# Patient Record
Sex: Male | Born: 1980
Health system: Southern US, Community
[De-identification: ages and names within clinical notes are randomized; demographics above are authoritative.]

## PROBLEM LIST (undated history)

## (undated) HISTORY — PX: LUMBAR DISC ARTHROPLASTY: SHX699

---

## 2009-11-02 ENCOUNTER — Emergency Department (HOSPITAL_COMMUNITY): Admission: EM | Admit: 2009-11-02 | Discharge: 2009-11-03 | Payer: Self-pay | Admitting: Emergency Medicine

## 2017-02-05 ENCOUNTER — Other Ambulatory Visit: Payer: Self-pay | Admitting: Orthopaedic Surgery

## 2017-02-05 DIAGNOSIS — M5136 Other intervertebral disc degeneration, lumbar region: Secondary | ICD-10-CM

## 2017-06-22 DIAGNOSIS — M5416 Radiculopathy, lumbar region: Secondary | ICD-10-CM | POA: Insufficient documentation

## 2017-07-19 ENCOUNTER — Encounter: Payer: Self-pay | Admitting: Family Medicine

## 2017-07-19 ENCOUNTER — Ambulatory Visit (INDEPENDENT_AMBULATORY_CARE_PROVIDER_SITE_OTHER): Payer: 59 | Admitting: Family Medicine

## 2017-07-19 VITALS — BP 126/78 | HR 79 | Temp 98.5°F | Ht 75.0 in | Wt 199.0 lb

## 2017-07-19 DIAGNOSIS — Z0000 Encounter for general adult medical examination without abnormal findings: Secondary | ICD-10-CM | POA: Diagnosis not present

## 2017-07-19 NOTE — Progress Notes (Signed)
Lucas Hoover. is a 36 y.o. male is here to [facility].   Patient Care Team: Helane Rima, DO as PCP - General (Family Medicine)   History of Present Illness:   Joseph Art, CMA, acting as scribe for Dr. Earlene Plater.  HPI: No concerns. Recent had lumbar discectomy. Doing well.  Health Maintenance Due  Topic Date Due  . HIV Screening  07/25/1996  . TETANUS/TDAP  07/25/2000   Depression screen PHQ 2/9 07/19/2017  Decreased Interest 0  Down, Depressed, Hopeless 0  PHQ - 2 Score 0   PMHx, SurgHx, SocialHx, Medications, and Allergies were reviewed in the Visit Navigator and updated as appropriate.   History reviewed. No pertinent past medical history.  Past Surgical History:  Procedure Laterality Date  . LUMBAR DISC ARTHROPLASTY     History reviewed. No pertinent family history.   Social History  Substance Use Topics  . Smoking status: Never Smoker  . Smokeless tobacco: Never Used  . Alcohol use 1.2 oz/week    2 Cans of beer per week   Current Medications and Allergies:  No current outpatient prescriptions on file.  No Known Allergies Review of Systems:   Pertinent items are noted in the HPI. Otherwise, ROS is negative.  Vitals:   Vitals:   07/19/17 0909  BP: 126/78  Pulse: 79  Temp: 98.5 F (36.9 C)  TempSrc: Oral  SpO2: 98%  Weight: 199 lb (90.3 kg)  Height:  (1.905 m)     Body mass index is 24.87 kg/m.   Physical Exam:   Physical Exam  Constitutional: He is oriented to person, place, and time. He appears well-developed and well-nourished. No distress.  HENT:  Head: Normocephalic and atraumatic.  Right Ear: External ear normal.  Left Ear: External ear normal.  Nose: Nose normal.  Mouth/Throat: Oropharynx is clear and moist.  Eyes: Pupils are equal, round, and reactive to light. Conjunctivae and EOM are normal.  Neck: Normal range of motion. Neck supple.  Cardiovascular: Normal rate, regular rhythm, normal heart sounds and  intact distal pulses.   Pulmonary/Chest: Effort normal and breath sounds normal.  Abdominal: Soft. Bowel sounds are normal.  Musculoskeletal: Normal range of motion.  Neurological: He is alert and oriented to person, place, and time.  Skin: Skin is warm and dry.  Psychiatric: He has a normal mood and affect. His behavior is normal. Judgment and thought content normal.  Nursing note and vitals reviewed.  Assessment and Plan:   [name] was seen today for establish care.  Diagnoses and all orders for this visit:  Routine physical examination   Patient Counseling:   Nutrition: Stressed importance of moderation in sodium/caffeine intake, saturated fat and cholesterol, caloric balance, sufficient intake of fresh fruits, vegetables, and fiber.    Stressed the importance of regular exercise.     Substance Abuse: Discussed cessation/primary prevention of tobacco, alcohol, or other drug use; driving or other dangerous activities under the influence; availability of treatment for abuse.     Injury prevention: Discussed safety belts, safety helmets, smoke detector, smoking near bedding or upholstery.     Sexuality: Discussed sexually transmitted diseases, partner selection, use of condoms, avoidance of unintended pregnancy and contraceptive alternatives.     Dental health: Discussed importance of regular tooth brushing, flossing, and dental visits.    Health maintenance and immunizations reviewed. Please refer to Health maintenance section.    . Reviewed expectations re: course of current medical issues. . Discussed self-management of symptoms. . Outlined  signs and symptoms indicating need for more acute intervention. . Patient verbalized understanding and all questions were answered. Marland Kitchen Health Maintenance issues including appropriate healthy diet, exercise, and smoking avoidance were discussed with patient. . See orders for this visit as documented in the electronic medical  record. . Patient received an After Visit Summary.  CMA served as Neurosurgeon during this visit. History, Physical, and Plan performed by medical provider. The above documentation has been reviewed and is accurate and complete. Helane Rima, D.O.  Helane Rima, DO [place] 07/20/2017  No future appointments.

## 2017-07-20 ENCOUNTER — Encounter: Payer: Self-pay | Admitting: Family Medicine

## 2017-09-09 ENCOUNTER — Ambulatory Visit: Payer: 59 | Admitting: Family Medicine

## 2017-09-09 ENCOUNTER — Encounter: Payer: Self-pay | Admitting: Family Medicine

## 2017-09-09 VITALS — BP 134/72 | HR 73 | Temp 98.3°F | Wt 201.8 lb

## 2017-09-09 DIAGNOSIS — J029 Acute pharyngitis, unspecified: Secondary | ICD-10-CM

## 2017-09-09 LAB — POCT RAPID STREP A (OFFICE): Rapid Strep A Screen: NEGATIVE

## 2017-09-09 MED ORDER — PREDNISONE 5 MG PO TABS: 5.0000 mg | ORAL_TABLET | Freq: Every day | ORAL | 0 refills | Status: DC

## 2017-09-09 MED ORDER — AMOXICILLIN 875 MG PO TABS: 875.0000 mg | ORAL_TABLET | Freq: Two times a day (BID) | ORAL | 0 refills | Status: DC

## 2017-09-09 NOTE — Progress Notes (Signed)
   Lucas Columbusobert Lawrence Glynn Octaveriscak Jr. is a 36 y.o. male here for an acute visit.  History of Present Illness:   Sore Throat   This is a new problem. The current episode started yesterday. The problem has been gradually worsening. There has been no fever. The pain is moderate. Associated symptoms include a hoarse voice and swollen glands. Pertinent negatives include no coughing, headaches, shortness of breath, stridor or trouble swallowing. He has tried acetaminophen and cool liquids for the symptoms. The treatment provided mild relief.   PMHx, SurgHx, SocialHx, Medications, and Allergies were reviewed in the Visit Navigator and updated as appropriate.  Current Medications:   .  None  No Known Allergies   Review of Systems:   Pertinent items are noted in the HPI. Otherwise, ROS is negative.  Vitals:   Vitals:   09/09/17 1427  BP: 134/72  Pulse: 73  Temp: 98.3 F (36.8 C)  TempSrc: Oral  SpO2: 98%  Weight: 201 lb 12.8 oz (91.5 kg)     Body mass index is 25.22 kg/m.   Physical Exam:   Physical Exam  Constitutional: He is oriented to person, place, and time. He appears well-developed and well-nourished. No distress.  HENT:  Head: Normocephalic and atraumatic.  Right Ear: Tympanic membrane and external ear normal.  Left Ear: Tympanic membrane and external ear normal.  Nose: Nose normal.  Mouth/Throat: Posterior oropharyngeal edema and posterior oropharyngeal erythema present. Tonsils are 2+ on the right. Tonsils are 2+ on the left.  Eyes: Conjunctivae and EOM are normal. Pupils are equal, round, and reactive to light.  Neck: Normal range of motion. Neck supple.  Cardiovascular: Normal rate, regular rhythm, normal heart sounds and intact distal pulses.  Pulmonary/Chest: Effort normal and breath sounds normal.  Abdominal: Soft. Bowel sounds are normal.  Musculoskeletal: Normal range of motion.  Neurological: He is alert and oriented to person, place, and time.  Skin: Skin is  warm and dry.  Psychiatric: He has a normal mood and affect. His behavior is normal. Judgment and thought content normal.  Nursing note and vitals reviewed.   Assessment and Plan:   Lucas Hoover was seen today for sore throat.  Diagnoses and all orders for this visit:  Pharyngitis, unspecified etiology Comments: Safety net provided. Red flags reviewed. Orders: -     amoxicillin (AMOXIL) 875 MG tablet; Take 1 tablet (875 mg total) by mouth 2 (two) times daily. (HOLD) -     predniSONE (DELTASONE) 5 MG tablet; Take 1 tablet (5 mg total) by mouth daily with breakfast. 6-5-4-3-2-1-off -     Culture, Group A Strep -     POCT Rapid Strep A  . Reviewed expectations re: course of current medical issues. . Discussed self-management of symptoms. . Outlined signs and symptoms indicating need for more acute intervention. . Patient verbalized understanding and all questions were answered. Marland Kitchen. Health Maintenance issues including appropriate healthy diet, exercise, and smoking avoidance were discussed with patient. . See orders for this visit as documented in the electronic medical record. . Patient received an After Visit Summary.  Helane RimaErica Lacretia Tindall, DO [place] 09/09/2017

## 2017-09-11 LAB — CULTURE, GROUP A STREP
MICRO NUMBER:: 81342644
SPECIMEN QUALITY:: ADEQUATE

## 2017-09-13 ENCOUNTER — Telehealth: Payer: Self-pay | Admitting: Family Medicine

## 2017-09-13 NOTE — Telephone Encounter (Signed)
Copied from CRM [id number]. Topic: Quick Communication - See Telephone Encounter >> Sep 13, 2017  4:41 PM Arlyss Gandyichardson, Virna Livengood N, NT wrote: CRM for notification. See Telephone encounter for: Pt calling to get lab results.   09/13/17.

## 2017-09-14 NOTE — Telephone Encounter (Signed)
Do not see where it was resulted yet. Ok to tell him it is negative.

## 2017-09-14 NOTE — Telephone Encounter (Signed)
Labs were negative.  If he is calling, I wonder if he is feeling poorly?  Please check on him.

## 2017-09-15 NOTE — Telephone Encounter (Signed)
Left message to return call to our office.  

## 2018-07-12 ENCOUNTER — Ambulatory Visit: Payer: 59 | Admitting: Family Medicine

## 2018-07-12 NOTE — Progress Notes (Deleted)
   Lucas Hoover. is a 37 y.o. male here for an acute visit.  History of Present Illness:   {CMA SCRIBE ATTESTATION}  HPI:   PMHx, SurgHx, SocialHx, Medications, and Allergies were reviewed in the Visit Navigator and updated as appropriate.  Current Medications:   Current Outpatient Medications:  .  amoxicillin (AMOXIL) 875 MG tablet, Take 1 tablet (875 mg total) by mouth 2 (two) times daily., Disp: 20 tablet, Rfl: 0 .  predniSONE (DELTASONE) 5 MG tablet, Take 1 tablet (5 mg total) by mouth daily with breakfast. 6-5-4-3-2-1-off, Disp: 21 tablet, Rfl: 0   No Known Allergies Review of Systems:   Pertinent items are noted in the HPI. Otherwise, ROS is negative.  Vitals:  There were no vitals filed for this visit.   There is no height or weight on file to calculate BMI.  Physical Exam:   Physical Exam  Results for orders placed or performed in visit on 09/09/17  Culture, Group A Strep  Result Value Ref Range   MICRO NUMBER: [id number]    SPECIMEN QUALITY: ADEQUATE    SOURCE: THROAT    STATUS: FINAL    RESULT: No group A Streptococcus isolated   POCT rapid strep A  Result Value Ref Range   Rapid Strep A Screen Negative Negative    Assessment and Plan:   There are no diagnoses linked to this encounter.  . Reviewed expectations re: course of current medical issues. . Discussed self-management of symptoms. . Outlined signs and symptoms indicating need for more acute intervention. . Patient verbalized understanding and all questions were answered. Marland Kitchen Health Maintenance issues including appropriate healthy diet, exercise, and smoking avoidance were discussed with patient. . See orders for this visit as documented in the electronic medical record. . Patient received an After Visit Summary.  *** CMA served as Neurosurgeon during this visit. History, Physical, and Plan performed by medical provider. The above documentation has been reviewed and is accurate and complete.  Helane Rima, D.O.  Helane Rima, DO [place], [facility] 07/12/2018

## 2018-07-15 ENCOUNTER — Encounter: Payer: Self-pay | Admitting: Family Medicine

## 2018-10-31 ENCOUNTER — Encounter: Payer: Self-pay | Admitting: Physician Assistant

## 2018-10-31 ENCOUNTER — Ambulatory Visit: Payer: 59 | Admitting: Physician Assistant

## 2018-10-31 VITALS — BP 110/68 | HR 109 | Temp 99.1°F | Ht 75.0 in | Wt 186.2 lb

## 2018-10-31 DIAGNOSIS — J020 Streptococcal pharyngitis: Secondary | ICD-10-CM | POA: Diagnosis not present

## 2018-10-31 LAB — POCT RAPID STREP A (OFFICE): Rapid Strep A Screen: POSITIVE — AB

## 2018-10-31 MED ORDER — PREDNISONE 5 MG PO TABS: ORAL_TABLET | ORAL | 0 refills | Status: DC

## 2018-10-31 MED ORDER — AMOXICILLIN 875 MG PO TABS
875.0000 mg | ORAL_TABLET | Freq: Two times a day (BID) | ORAL | 0 refills | Status: DC
Start: 2018-10-31 — End: 2019-03-08

## 2018-10-31 NOTE — Progress Notes (Signed)
Lucas Hoover. is a 38 y.o. male here for a new problem.  I acted as a Neurosurgeon for [employer], PA-C  Lucas Mull, LPN  History of Present Illness:   Chief Complaint  Patient presents with  . Sore Throat    Sore Throat   This is a new problem. Episode onset: Started on Saturday again was treated for Strep 5-6 weeks ago. The problem has been gradually worsening. Neither side of throat is experiencing more pain than the other. There has been no fever. The pain is at a severity of 8/10. The pain is moderate. Associated symptoms include a hoarse voice, neck pain, swollen glands and trouble swallowing. Pertinent negatives include no abdominal pain, coughing, diarrhea, ear discharge, ear pain, headaches, shortness of breath or vomiting. Exposure to: Has Childeren at home. He has tried nothing for the symptoms.    History reviewed. No pertinent past medical history.   Social History   Socioeconomic History  . Marital status: Married    Spouse name: Not on file  . Number of children: Not on file  . Years of education: Not on file  . Highest education level: Not on file  Occupational History  . Not on file  Social Needs  . Financial resource strain: Not on file  . Food insecurity:    Worry: Not on file    Inability: Not on file  . Transportation needs:    Medical: Not on file    Non-medical: Not on file  Tobacco Use  . Smoking status: Never Smoker  . Smokeless tobacco: Never Used  Substance and Sexual Activity  . Alcohol use: Yes    Alcohol/week: 2.0 standard drinks    Types: 2 Cans of beer per week  . Drug use: No  . Sexual activity: Not on file  Lifestyle  . Physical activity:    Days per week: Not on file    Minutes per session: Not on file  . Stress: Not on file  Relationships  . Social connections:    Talks on phone: Not on file    Gets together: Not on file    Attends religious service: Not on file    Active member of club or organization: Not on  file    Attends meetings of clubs or organizations: Not on file    Relationship status: Not on file  . Intimate partner violence:    Fear of current or ex partner: Not on file    Emotionally abused: Not on file    Physically abused: Not on file    Forced sexual activity: Not on file  Other Topics Concern  . Not on file  Social History Narrative  . Not on file    Past Surgical History:  Procedure Laterality Date  . LUMBAR DISC ARTHROPLASTY      History reviewed. No pertinent family history.  No Known Allergies  Current Medications:   Current Outpatient Medications:  .  amoxicillin (AMOXIL) 875 MG tablet, Take 1 tablet (875 mg total) by mouth 2 (two) times daily., Disp: 20 tablet, Rfl: 0 .  predniSONE (DELTASONE) 5 MG tablet, 6-5-4-3-2-1-off, Disp: 21 tablet, Rfl: 0   Review of Systems:   Review of Systems  HENT: Positive for hoarse voice and trouble swallowing. Negative for ear discharge and ear pain.   Respiratory: Negative for cough and shortness of breath.   Gastrointestinal: Negative for abdominal pain, diarrhea and vomiting.  Musculoskeletal: Positive for neck pain.  Neurological: Negative for headaches.  Vitals:   Vitals:   10/31/18 0942  BP: 110/68  Pulse: (!) 109  Temp: 99.1 F (37.3 C)  TempSrc: Oral  SpO2: 99%  Weight: 186 lb 4 oz (84.5 kg)  Height: 6\' 3"  (1.905 m)     Body mass index is 23.28 kg/m.  Physical Exam:   Physical Exam Vitals signs and nursing note reviewed.  Constitutional:      General: He is not in acute distress.    Appearance: He is well-developed. He is not ill-appearing or toxic-appearing.  HENT:     Head: Normocephalic and atraumatic.     Right Ear: Tympanic membrane, ear canal and external ear normal. Tympanic membrane is not erythematous, retracted or bulging.     Left Ear: Tympanic membrane, ear canal and external ear normal. Tympanic membrane is not erythematous, retracted or bulging.     Nose: Nose normal.      Right Sinus: No maxillary sinus tenderness or frontal sinus tenderness.     Left Sinus: No maxillary sinus tenderness or frontal sinus tenderness.     Mouth/Throat:     Pharynx: Uvula midline. No posterior oropharyngeal erythema.     Tonsils: Tonsillar exudate present. Swelling: 2+ on the right. 2+ on the left.  Eyes:     General: Lids are normal.     Conjunctiva/sclera: Conjunctivae normal.  Neck:     Trachea: Trachea normal.  Cardiovascular:     Rate and Rhythm: Normal rate and regular rhythm.     Heart sounds: Normal heart sounds, S1 normal and S2 normal.  Pulmonary:     Effort: Pulmonary effort is normal.     Breath sounds: Normal breath sounds. No decreased breath sounds, wheezing, rhonchi or rales.  Lymphadenopathy:     Cervical: Cervical adenopathy present.     Right cervical: Superficial cervical adenopathy present.     Left cervical: Superficial cervical adenopathy present.  Skin:    General: Skin is warm and dry.  Neurological:     Mental Status: He is alert.  Psychiatric:        Speech: Speech normal.        Behavior: Behavior normal. Behavior is cooperative.    Results for orders placed or performed in visit on 10/31/18  POCT rapid strep A  Result Value Ref Range   Rapid Strep A Screen Positive (A) Negative     Assessment and Plan:   Lucas MaduroRobert was seen today for sore throat.  Diagnoses and all orders for this visit:  Strep pharyngitis -     POCT rapid strep A  Other orders -     amoxicillin (AMOXIL) 875 MG tablet; Take 1 tablet (875 mg total) by mouth 2 (two) times daily. -     predniSONE (DELTASONE) 5 MG tablet; 6-5-4-3-2-1-off   No red flags on exam.  Rapid strep positive in office. Will initiate Amoxicillin and Prednisone per orders. Discussed taking medications as prescribed. Reviewed return precautions including worsening fever, SOB, worsening cough or other concerns. Push fluids and rest. I recommend that patient follow-up if symptoms worsen or persist  despite treatment x 7-10 days, sooner if needed.  . Reviewed expectations re: course of current medical issues. . Discussed self-management of symptoms. . Outlined signs and symptoms indicating need for more acute intervention. . Patient verbalized understanding and all questions were answered. . See orders for this visit as documented in the electronic medical record. . Patient received an After-Visit Summary.  CMA or LPN served as Neurosurgeonscribe during this  visit. History, Physical, and Plan performed by medical provider. The above documentation has been reviewed and is accurate and complete.  Jarold Motto, PA-C

## 2018-10-31 NOTE — Patient Instructions (Signed)
It was great to see you!  You have strep throat.  Start oral antibiotic and take as directed.  May take oral steroid to help with pain and swelling. You don't need to take ibuprofen while on this.  Push fluids and get plenty of rest. Please return if you are not improving as expected, or if you have high fevers (>101.5) or difficulty swallowing or worsening productive cough.  Call clinic with questions.  I hope you start feeling better soon!

## 2019-03-08 ENCOUNTER — Other Ambulatory Visit: Payer: Self-pay

## 2019-03-08 ENCOUNTER — Encounter: Payer: Self-pay | Admitting: Family Medicine

## 2019-03-08 ENCOUNTER — Ambulatory Visit (INDEPENDENT_AMBULATORY_CARE_PROVIDER_SITE_OTHER): Payer: 59 | Admitting: Family Medicine

## 2019-03-08 VITALS — Ht 75.0 in | Wt 185.0 lb

## 2019-03-08 DIAGNOSIS — R6882 Decreased libido: Secondary | ICD-10-CM | POA: Diagnosis not present

## 2019-03-08 DIAGNOSIS — R5383 Other fatigue: Secondary | ICD-10-CM

## 2019-03-08 DIAGNOSIS — Z13220 Encounter for screening for lipoid disorders: Secondary | ICD-10-CM

## 2019-03-08 DIAGNOSIS — R739 Hyperglycemia, unspecified: Secondary | ICD-10-CM

## 2019-03-08 DIAGNOSIS — L659 Nonscarring hair loss, unspecified: Secondary | ICD-10-CM

## 2019-03-08 DIAGNOSIS — Z114 Encounter for screening for human immunodeficiency virus [HIV]: Secondary | ICD-10-CM | POA: Diagnosis not present

## 2019-03-08 NOTE — Progress Notes (Signed)
Virtual Visit via Video   Due to the COVID-19 pandemic, this visit was completed with telemedicine (audio/video) technology to reduce patient and provider exposure as well as to preserve personal protective equipment.   I connected with Lucas Hoover. by a video enabled telemedicine application and verified that I am speaking with the correct person using two identifiers. Location patient: Home Location provider: [facility], Office Persons participating in the virtual visit: Lucas Supino Mckeithan Jr., Helane Rima, DO Barnie Mort, CMA acting as scribe for Dr. Helane Rima.    I discussed the limitations of evaluation and management by telemedicine and the availability of in person appointments. The patient expressed understanding and agreed to proceed.  Care Team   Patient Care Team: Helane Rima, DO as PCP - General (Family Medicine)  Subjective:   HPI: Patient has had decreased sex drive over the last year. He is also having some thinning of his hair. He is concerned about issues with his thyroid and would like to have labs checked.   Review of Systems  Constitutional: Negative for chills and fever.  HENT: Negative for hearing loss and tinnitus.   Eyes: Negative for blurred vision and double vision.  Respiratory: Negative for cough.   Cardiovascular: Negative for chest pain and palpitations.  Gastrointestinal: Negative for nausea and vomiting.  Genitourinary: Negative for dysuria and urgency.  Neurological: Negative for dizziness and headaches.  Psychiatric/Behavioral: Negative for suicidal ideas.    Patient Active Problem List   Diagnosis Date Noted  . Lumbar radiculopathy 06/22/2017    Social History   Tobacco Use  . Smoking status: Never Smoker  . Smokeless tobacco: Never Used  Substance Use Topics  . Alcohol use: Yes    Alcohol/week: 2.0 standard drinks    Types: 2 Cans of beer per week   No current outpatient medications on file.  No  Known Allergies  Objective:   VITALS: Per patient if applicable, see vitals. GENERAL: Alert, appears well and in no acute distress. HEENT: Atraumatic, conjunctiva clear, no obvious abnormalities on inspection of external nose and ears. NECK: Normal movements of the head and neck. CARDIOPULMONARY: No increased WOB. Speaking in clear sentences. I:E ratio WNL.  MS: Moves all visible extremities without noticeable abnormality. PSYCH: Pleasant and cooperative, well-groomed. Speech normal rate and rhythm. Affect is appropriate. Insight and judgement are appropriate. Attention is focused, linear, and appropriate.  NEURO: CN grossly intact. Oriented as arrived to appointment on time with no prompting. Moves both UE equally.  SKIN: No obvious lesions, wounds, erythema, or cyanosis noted on face or hands.  Depression screen PHQ 2/9 07/19/2017  Decreased Interest 0  Down, Depressed, Hopeless 0  PHQ - 2 Score 0   Assessment and Plan:   Lucas Hoover was seen today for follow-up.  Diagnoses and all orders for this visit:  Screening for HIV (human immunodeficiency virus) -     HIV Antibody (routine testing w rflx); Future  Other fatigue -     CBC with Differential/Platelet; Future -     Comprehensive metabolic panel; Future -     Hemoglobin A1c; Future -     TSH; Future -     Testos,Total,Free and SHBG (Male); Future  Decreased libido -     TSH; Future -     Testos,Total,Free and SHBG (Male); Future  Hair thinning -     TSH; Future -     Testos,Total,Free and SHBG (Male); Future  Screening for lipid disorders -  Lipid panel; Future  Hyperglycemia -     Hemoglobin A1c; Future    . COVID-19 Education: The signs and symptoms of COVID-19 were discussed with the patient and how to seek care for testing if needed. The importance of social distancing was discussed today. . Reviewed expectations re: course of current medical issues. . Discussed self-management of symptoms. . Outlined  signs and symptoms indicating need for more acute intervention. . Patient verbalized understanding and all questions were answered. Marland Kitchen. Health Maintenance issues including appropriate healthy diet, exercise, and smoking avoidance were discussed with patient. . See orders for this visit as documented in the electronic medical record.  Helane RimaErica Hosey Burmester, DO  Records requested if needed. Time spent: 25  minutes, of which >50% was spent in obtaining information about his symptoms, reviewing his previous labs, evaluations, and treatments, counseling him about his condition (please see the discussed topics above), and developing a plan to further investigate it; he had a number of questions which I addressed.

## 2019-03-09 ENCOUNTER — Other Ambulatory Visit (INDEPENDENT_AMBULATORY_CARE_PROVIDER_SITE_OTHER): Payer: 59

## 2019-03-09 DIAGNOSIS — Z114 Encounter for screening for human immunodeficiency virus [HIV]: Secondary | ICD-10-CM | POA: Diagnosis not present

## 2019-03-09 DIAGNOSIS — R5383 Other fatigue: Secondary | ICD-10-CM

## 2019-03-09 LAB — COMPREHENSIVE METABOLIC PANEL
ALT: 15 U/L (ref 0–53)
AST: 14 U/L (ref 0–37)
Albumin: 4.4 g/dL (ref 3.5–5.2)
Alkaline Phosphatase: 65 U/L (ref 39–117)
BUN: 12 mg/dL (ref 6–23)
CO2: 32 mEq/L (ref 19–32)
Calcium: 9.2 mg/dL (ref 8.4–10.5)
Chloride: 101 mEq/L (ref 96–112)
Creatinine, Ser: 0.9 mg/dL (ref 0.40–1.50)
GFR: 94.63 mL/min (ref >60.00)
Glucose, Bld: 87 mg/dL (ref 70–99)
Potassium: 4.4 mEq/L (ref 3.5–5.1)
Sodium: 139 mEq/L (ref 135–145)
Total Bilirubin: 0.5 mg/dL (ref 0.2–1.2)
Total Protein: 7.2 g/dL (ref 6.0–8.3)

## 2019-03-09 LAB — LIPID PANEL
Cholesterol: 204 mg/dL — ABNORMAL HIGH (ref 0–200)
HDL: 56.5 mg/dL (ref >39.00)
LDL Cholesterol: 127 mg/dL — ABNORMAL HIGH (ref 0–99)
NonHDL: 147.05
Total CHOL/HDL Ratio: 4
Triglycerides: 101 mg/dL (ref 0.0–149.0)
VLDL: 20.2 mg/dL (ref 0.0–40.0)

## 2019-03-09 LAB — TSH: TSH: 0.86 u[IU]/mL (ref 0.35–4.50)

## 2019-03-09 LAB — CBC WITH DIFFERENTIAL/PLATELET
Basophils Absolute: 0 10*3/uL (ref 0.0–0.1)
Basophils Relative: 0.9 % (ref 0.0–3.0)
Eosinophils Absolute: 0.1 10*3/uL (ref 0.0–0.7)
Eosinophils Relative: 1.1 % (ref 0.0–5.0)
HCT: 45.2 % (ref 39.0–52.0)
Hemoglobin: 15.5 g/dL (ref 13.0–17.0)
Lymphocytes Relative: 22.5 % (ref 12.0–46.0)
Lymphs Abs: 1.2 10*3/uL (ref 0.7–4.0)
MCHC: 34.2 g/dL (ref 30.0–36.0)
MCV: 88.2 fl (ref 78.0–100.0)
Monocytes Absolute: 0.5 10*3/uL (ref 0.1–1.0)
Monocytes Relative: 9.2 % (ref 3.0–12.0)
Neutro Abs: 3.4 10*3/uL (ref 1.4–7.7)
Neutrophils Relative %: 66.3 % (ref 43.0–77.0)
Platelets: 272 10*3/uL (ref 150.0–400.0)
RBC: 5.13 Mil/uL (ref 4.22–5.81)
RDW: 12.9 % (ref 11.5–15.5)
WBC: 5.2 10*3/uL (ref 4.0–10.5)

## 2019-03-09 LAB — HEMOGLOBIN A1C: Hgb A1c MFr Bld: 5.4 % (ref 4.6–6.5)

## 2019-03-10 ENCOUNTER — Encounter: Payer: Self-pay | Admitting: Family Medicine

## 2019-03-13 LAB — TESTOS,TOTAL,FREE AND SHBG (FEMALE)
Free Testosterone: 146.2 pg/mL (ref 35.0–155.0)
Sex Hormone Binding: 20 nmol/L (ref 10–50)
Testosterone, Total, LC-MS-MS: 504 ng/dL (ref 250–1100)

## 2019-03-13 LAB — HIV ANTIBODY (ROUTINE TESTING W REFLEX): HIV 1&2 Ab, 4th Generation: NONREACTIVE

## 2020-11-18 ENCOUNTER — Other Ambulatory Visit: Payer: Self-pay

## 2020-11-18 ENCOUNTER — Encounter: Payer: Self-pay | Admitting: Physician Assistant

## 2020-11-18 ENCOUNTER — Ambulatory Visit: Payer: 59 | Admitting: Physician Assistant

## 2020-11-18 VITALS — BP 120/90 | HR 73 | Temp 97.6°F | Ht 75.0 in | Wt 215.0 lb

## 2020-11-18 DIAGNOSIS — R222 Localized swelling, mass and lump, trunk: Secondary | ICD-10-CM

## 2020-11-18 LAB — CBC WITH DIFFERENTIAL/PLATELET
Basophils Absolute: 0 10*3/uL (ref 0.0–0.1)
Basophils Relative: 0.7 % (ref 0.0–3.0)
Eosinophils Absolute: 0.1 10*3/uL (ref 0.0–0.7)
Eosinophils Relative: 1.5 % (ref 0.0–5.0)
HCT: 45.9 % (ref 39.0–52.0)
Hemoglobin: 15.7 g/dL (ref 13.0–17.0)
Lymphocytes Relative: 21.7 % (ref 12.0–46.0)
Lymphs Abs: 1.6 10*3/uL (ref 0.7–4.0)
MCHC: 34.1 g/dL (ref 30.0–36.0)
MCV: 86.4 fl (ref 78.0–100.0)
Monocytes Absolute: 0.8 10*3/uL (ref 0.1–1.0)
Monocytes Relative: 10.7 % (ref 3.0–12.0)
Neutro Abs: 4.8 10*3/uL (ref 1.4–7.7)
Neutrophils Relative %: 65.4 % (ref 43.0–77.0)
Platelets: 297 10*3/uL (ref 150.0–400.0)
RBC: 5.31 Mil/uL (ref 4.22–5.81)
RDW: 12.7 % (ref 11.5–15.5)
WBC: 7.3 10*3/uL (ref 4.0–10.5)

## 2020-11-18 LAB — COMPREHENSIVE METABOLIC PANEL
ALT: 22 U/L (ref 0–53)
AST: 19 U/L (ref 0–37)
Albumin: 4.6 g/dL (ref 3.5–5.2)
Alkaline Phosphatase: 68 U/L (ref 39–117)
BUN: 19 mg/dL (ref 6–23)
CO2: 30 mEq/L (ref 19–32)
Calcium: 9.6 mg/dL (ref 8.4–10.5)
Chloride: 102 mEq/L (ref 96–112)
Creatinine, Ser: 0.86 mg/dL (ref 0.40–1.50)
GFR: 109.22 mL/min (ref >60.00)
Glucose, Bld: 101 mg/dL — ABNORMAL HIGH (ref 70–99)
Potassium: 4.1 mEq/L (ref 3.5–5.1)
Sodium: 139 mEq/L (ref 135–145)
Total Bilirubin: 0.3 mg/dL (ref 0.2–1.2)
Total Protein: 7.7 g/dL (ref 6.0–8.3)

## 2020-11-18 NOTE — Patient Instructions (Signed)
It was great to see you!  Keep me posted on your symptoms. If it does not continue to improve, we will get an ultrasound.  Also we will update your blood work today.  Take care,  Jarold Motto PA-C

## 2020-11-18 NOTE — Progress Notes (Signed)
Lucas Hoover. is a 40 y.o. male here for a new problem.  I acted as a Neurosurgeon for [employer], PA-C Corky Mull, LPN   History of Present Illness:   Chief Complaint  Patient presents with  . Edema    HPI   Edema Pt c/o swelling left side mid abdomen noticed it Thursday night. Area was tender around a 6/10 over the weekend and has been gradually improving with time. Denies: known trauma, fever, chills, skin discoloration, changes in urination, pain with urination, pain with defecation, unintentional weight loss.  Reports history of fatty tumors in his family, ?lipomas    History reviewed. No pertinent past medical history.   Social History   Tobacco Use  . Smoking status: Never Smoker  . Smokeless tobacco: Never Used  Substance Use Topics  . Alcohol use: Yes    Alcohol/week: 2.0 standard drinks    Types: 2 Cans of beer per week  . Drug use: No    Past Surgical History:  Procedure Laterality Date  . LUMBAR DISC ARTHROPLASTY      Family History  Problem Relation Age of Onset  . Thyroid disease Mother     No Known Allergies  Current Medications:  No current outpatient medications on file.   Review of Systems:   ROS Negative unless otherwise specified per HPI.  Vitals:   Vitals:   11/18/20 1335  BP: 120/90  Pulse: 73  Temp: 97.6 F (36.4 C)  TempSrc: Temporal  SpO2: 97%  Weight: 215 lb (97.5 kg)  Height: 6\' 3"  (1.905 m)     Body mass index is 26.87 kg/m.  Physical Exam:   Physical Exam Vitals and nursing note reviewed.  Constitutional:      General: He is not in acute distress.    Appearance: He is well-developed. He is not ill-appearing, toxic-appearing or sickly-appearing.  Cardiovascular:     Rate and Rhythm: Normal rate and regular rhythm.     Pulses: Normal pulses.     Heart sounds: Normal heart sounds, S1 normal and S2 normal.     Comments: No LE edema Pulmonary:     Effort: Pulmonary effort is normal.      Breath sounds: Normal breath sounds.  Musculoskeletal:     Comments: Area of very mild diffuse swelling to LLQ of abdomen/hip area; but without TTP or skin changes. Some course subcutaneous tissue can be palpated compared to R side, but not in discrete nodules.  Skin:    General: Skin is warm, dry and intact.  Neurological:     Mental Status: He is alert.     GCS: GCS eye subscore is 4. GCS verbal subscore is 5. GCS motor subscore is 6.  Psychiatric:        Mood and Affect: Mood and affect normal.        Speech: Speech normal.        Behavior: Behavior normal. Behavior is cooperative.       Assessment and Plan:   Lucas Hoover was seen today for edema.  Diagnoses and all orders for this visit:  Localized swelling of abdominal wall -     CBC with Differential/Platelet -     Comprehensive metabolic panel   Unclear etiology. Exam normal and symptoms improving with time. Update CBC and CMP. Will obtain u/s if symptoms worsen or do not completely resolved.  CMA or LPN served as scribe during this visit. History, Physical, and Plan performed by medical provider. The above documentation  has been reviewed and is accurate and complete.  Inda Coke, PA-C

## 2020-11-19 ENCOUNTER — Other Ambulatory Visit: Payer: Self-pay | Admitting: Physician Assistant

## 2020-11-19 ENCOUNTER — Encounter: Payer: Self-pay | Admitting: Physician Assistant

## 2020-11-20 ENCOUNTER — Other Ambulatory Visit: Payer: Self-pay | Admitting: Physician Assistant

## 2020-11-20 DIAGNOSIS — R222 Localized swelling, mass and lump, trunk: Secondary | ICD-10-CM

## 2020-11-21 ENCOUNTER — Ambulatory Visit (HOSPITAL_COMMUNITY): Payer: 59

## 2020-11-22 ENCOUNTER — Ambulatory Visit
Admission: RE | Admit: 2020-11-22 | Discharge: 2020-11-22 | Disposition: A | Discharge status: Home / Self Care | Payer: 59 | Source: Ambulatory Visit | Attending: Physician Assistant | Admitting: Physician Assistant

## 2020-11-22 DIAGNOSIS — R222 Localized swelling, mass and lump, trunk: Secondary | ICD-10-CM

## 2021-02-07 ENCOUNTER — Other Ambulatory Visit: Payer: Self-pay

## 2021-02-07 ENCOUNTER — Encounter: Payer: Self-pay | Admitting: Physician Assistant

## 2021-02-07 ENCOUNTER — Ambulatory Visit (INDEPENDENT_AMBULATORY_CARE_PROVIDER_SITE_OTHER): Payer: 59 | Admitting: Physician Assistant

## 2021-02-07 VITALS — BP 126/82 | HR 64 | Temp 98.2°F | Ht 75.0 in | Wt 216.2 lb

## 2021-02-07 DIAGNOSIS — E663 Overweight: Secondary | ICD-10-CM

## 2021-02-07 DIAGNOSIS — E785 Hyperlipidemia, unspecified: Secondary | ICD-10-CM

## 2021-02-07 DIAGNOSIS — Z0000 Encounter for general adult medical examination without abnormal findings: Secondary | ICD-10-CM | POA: Diagnosis not present

## 2021-02-07 DIAGNOSIS — Z1159 Encounter for screening for other viral diseases: Secondary | ICD-10-CM | POA: Diagnosis not present

## 2021-02-07 LAB — COMPREHENSIVE METABOLIC PANEL
ALT: 34 U/L (ref 0–53)
AST: 23 U/L (ref 0–37)
Albumin: 4.8 g/dL (ref 3.5–5.2)
Alkaline Phosphatase: 76 U/L (ref 39–117)
BUN: 12 mg/dL (ref 6–23)
CO2: 29 mEq/L (ref 19–32)
Calcium: 9.6 mg/dL (ref 8.4–10.5)
Chloride: 102 mEq/L (ref 96–112)
Creatinine, Ser: 0.91 mg/dL (ref 0.40–1.50)
GFR: 106.15 mL/min (ref >60.00)
Glucose, Bld: 90 mg/dL (ref 70–99)
Potassium: 4.4 mEq/L (ref 3.5–5.1)
Sodium: 139 mEq/L (ref 135–145)
Total Bilirubin: 0.4 mg/dL (ref 0.2–1.2)
Total Protein: 7.6 g/dL (ref 6.0–8.3)

## 2021-02-07 LAB — CBC WITH DIFFERENTIAL/PLATELET
Basophils Absolute: 0 10*3/uL (ref 0.0–0.1)
Basophils Relative: 0.5 % (ref 0.0–3.0)
Eosinophils Absolute: 0.1 10*3/uL (ref 0.0–0.7)
Eosinophils Relative: 1.6 % (ref 0.0–5.0)
HCT: 46.8 % (ref 39.0–52.0)
Hemoglobin: 15.9 g/dL (ref 13.0–17.0)
Lymphocytes Relative: 24.6 % (ref 12.0–46.0)
Lymphs Abs: 1.3 10*3/uL (ref 0.7–4.0)
MCHC: 33.9 g/dL (ref 30.0–36.0)
MCV: 87 fl (ref 78.0–100.0)
Monocytes Absolute: 0.5 10*3/uL (ref 0.1–1.0)
Monocytes Relative: 10.3 % (ref 3.0–12.0)
Neutro Abs: 3.2 10*3/uL (ref 1.4–7.7)
Neutrophils Relative %: 63 % (ref 43.0–77.0)
Platelets: 271 10*3/uL (ref 150.0–400.0)
RBC: 5.39 Mil/uL (ref 4.22–5.81)
RDW: 13 % (ref 11.5–15.5)
WBC: 5.1 10*3/uL (ref 4.0–10.5)

## 2021-02-07 LAB — LIPID PANEL
Cholesterol: 264 mg/dL — ABNORMAL HIGH (ref 0–200)
HDL: 50.7 mg/dL (ref >39.00)
LDL Cholesterol: 189 mg/dL — ABNORMAL HIGH (ref 0–99)
NonHDL: 213.41
Total CHOL/HDL Ratio: 5
Triglycerides: 122 mg/dL (ref 0.0–149.0)
VLDL: 24.4 mg/dL (ref 0.0–40.0)

## 2021-02-07 NOTE — Progress Notes (Signed)
Subjective:    Ulysees Robarts. is a 40 y.o. male and is here for a comprehensive physical exam.  HPI  Health Maintenance Due  Topic Date Due  . Hepatitis C Screening  Never done    Acute Concerns: None  Chronic Issues: HLD -- borderline elevated LDL at last check; currently not on medications. Denies significant family hx of heart disease.  Health Maintenance: Immunizations -- UTD Colonoscopy -- n/a; no fam hx PSA -- n/a; no fam hx Diet -- eats out 2-3 x week; drinks plenty of water; limited juices/sodas Sleep habits -- no concerns Exercise -- goes in spurts; tries to stay active; sedentary job Weight -- Weight: 216 lb 4 oz (98.1 kg)  Weight history Wt Readings from Last 10 Encounters:  02/07/21 216 lb 4 oz (98.1 kg)  11/18/20 215 lb (97.5 kg)  03/08/19 185 lb (83.9 kg)  10/31/18 186 lb 4 oz (84.5 kg)  09/09/17 201 lb 12.8 oz (91.5 kg)  07/19/17 199 lb (90.3 kg)   Body mass index is 27.03 kg/m. Mood -- overall stable Tobacco use --  Tobacco Use: Low Risk   . Smoking Tobacco Use: Never Smoker  . Smokeless Tobacco Use: Never Used    Alcohol use ---  reports current alcohol use of about 2.0 standard drinks of alcohol per week.   Depression screen PHQ 2/9 02/07/2021  Decreased Interest 0  Down, Depressed, Hopeless 0  PHQ - 2 Score 0   UTD with dentist and eye doctor  Other providers/specialists: Patient Care Team: Jarold Motto, [place] as PCP - General (Physician Assistant)   PMHx, SurgHx, SocialHx, Medications, and Allergies were reviewed in the Visit Navigator and updated as appropriate.   History reviewed. No pertinent past medical history.   Past Surgical History:  Procedure Laterality Date  . LUMBAR DISC ARTHROPLASTY       Family History  Problem Relation Age of Onset  . Thyroid disease Mother   . Hypertension Mother   . Breast cancer Paternal Grandmother   . Colon cancer Neg Hx   . Prostate cancer Neg Hx     Social History    Tobacco Use  . Smoking status: Never Smoker  . Smokeless tobacco: Never Used  Vaping Use  . Vaping Use: Never used  Substance Use Topics  . Alcohol use: Yes    Alcohol/week: 2.0 standard drinks    Types: 2 Cans of beer per week  . Drug use: No    Review of Systems:   ROS  Objective:   Vitals:   02/07/21 0923  BP: 126/82  Pulse: 64  Temp: 98.2 F (36.8 C)  SpO2: 99%   Body mass index is 27.03 kg/m.  General Appearance:  Alert, cooperative, no distress, appears stated age  Head:  Normocephalic, without obvious abnormality, atraumatic  Eyes:  PERRL, conjunctiva/corneas clear, EOM's intact, fundi benign, both eyes       Ears:  Normal TM's and external ear canals, both ears  Nose: Nares normal, septum midline, mucosa normal, no drainage    or sinus tenderness  Throat: Lips, mucosa, and tongue normal; teeth and gums normal  Neck: Supple, symmetrical, trachea midline, no adenopathy; thyroid:  No enlargement/tenderness/nodules; no carotit bruit or JVD  Back:   Symmetric, no curvature, ROM normal, no CVA tenderness  Lungs:   Clear to auscultation bilaterally, respirations unlabored  Chest wall:  No tenderness or deformity  Heart:  Regular rate and rhythm, S1 and S2 normal, no murmur, rub  or gallop  Abdomen:   Soft, non-tender, bowel sounds active all four quadrants, no masses, no organomegaly  Extremities: Extremities normal, atraumatic, no cyanosis or edema  Prostate: Not done.   Skin: Skin color, texture, turgor normal, no rashes or lesions  Lymph nodes: Cervical, supraclavicular, and axillary nodes normal  Neurologic: CNII-XII grossly intact. Normal strength, sensation and reflexes throughout    Assessment/Plan:   Kendre was seen today for annual exam.  Diagnoses and all orders for this visit:  Routine physical examination Today patient counseled on age appropriate routine health concerns for screening and prevention, each reviewed and up to date or declined.  Immunizations reviewed and up to date or declined. Labs ordered and reviewed. Risk factors for depression reviewed and negative. Hearing function and visual acuity are intact. ADLs screened and addressed as needed. Functional ability and level of safety reviewed and appropriate. Education, counseling and referrals performed based on assessed risks today. Patient provided with a copy of personalized plan for preventive services.  Overweight Encouraged more consistent exercise and eating less takeout. -     CBC with Differential/Platelet -     Comprehensive metabolic panel  Hyperlipidemia, unspecified hyperlipidemia type Update blood work today and provide recommendations as able. -     Lipid panel  Encounter for screening for other viral diseases -     Hepatitis C antibody   Patient Counseling: [x]   Nutrition: Stressed importance of moderation in sodium/caffeine intake, saturated fat and cholesterol, caloric balance, sufficient intake of fresh fruits, vegetables, and fiber.  [x]   Stressed the importance of regular exercise.   []   Substance Abuse: Discussed cessation/primary prevention of tobacco, alcohol, or other drug use; driving or other dangerous activities under the influence; availability of treatment for abuse.   [x]   Injury prevention: Discussed safety belts, safety helmets, smoke detector, smoking near bedding or upholstery.   []   Sexuality: Discussed sexually transmitted diseases, partner selection, use of condoms, avoidance of unintended pregnancy  and contraceptive alternatives.   [x]   Dental health: Discussed importance of regular tooth brushing, flossing, and dental visits.  [x]   Health maintenance and immunizations reviewed. Please refer to Health maintenance section.    , PA-C [facility]

## 2021-02-07 NOTE — Patient Instructions (Signed)
It was great to see you!  Goals: -- Try to eat out less -- Try to maintain consistent exercise  Please go to the lab for blood work.   Our office will call you with your results unless you have chosen to receive results via MyChart.  If your blood work is normal we will follow-up each year for physicals and as scheduled for chronic medical problems.  If anything is abnormal we will treat accordingly and get you in for a follow-up.  Take care,  [facility] Maintenance, Male Adopting a healthy lifestyle and getting preventive care are important in promoting health and wellness. Ask your health care provider about:  The right schedule for you to have regular tests and exams.  Things you can do on your own to prevent diseases and keep yourself healthy. What should I know about diet, weight, and exercise? Eat a healthy diet  Eat a diet that includes plenty of vegetables, fruits, low-fat dairy products, and lean protein.  Do not eat a lot of foods that are high in solid fats, added sugars, or sodium.   Maintain a healthy weight Body mass index (BMI) is a measurement that can be used to identify possible weight problems. It estimates body fat based on height and weight. Your health care provider can help determine your BMI and help you achieve or maintain a healthy weight. Get regular exercise Get regular exercise. This is one of the most important things you can do for your health. Most adults should:  Exercise for at least 150 minutes each week. The exercise should increase your heart rate and make you sweat (moderate-intensity exercise).  Do strengthening exercises at least twice a week. This is in addition to the moderate-intensity exercise.  Spend less time sitting. Even light physical activity can be beneficial. Watch cholesterol and blood lipids Have your blood tested for lipids and cholesterol at 40 years of age, then have this test every 5 years. You may need to  have your cholesterol levels checked more often if:  Your lipid or cholesterol levels are high.  You are older than 40 years of age.  You are at high risk for heart disease. What should I know about cancer screening? Many types of cancers can be detected early and may often be prevented. Depending on your health history and family history, you may need to have cancer screening at various ages. This may include screening for:  Colorectal cancer.  Prostate cancer.  Skin cancer.  Lung cancer. What should I know about heart disease, diabetes, and high blood pressure? Blood pressure and heart disease  High blood pressure causes heart disease and increases the risk of stroke. This is more likely to develop in people who have high blood pressure readings, are of African descent, or are overweight.  Talk with your health care provider about your target blood pressure readings.  Have your blood pressure checked: ? Every 3-5 years if you are 41-66 years of age. ? Every year if you are 21 years old or older.  If you are between the ages of 51 and 74 and are a current or former smoker, ask your health care provider if you should have a one-time screening for abdominal aortic aneurysm (AAA). Diabetes Have regular diabetes screenings. This checks your fasting blood sugar level. Have the screening done:  Once every three years after age 3 if you are at a normal weight and have a low risk for diabetes.  More  often and at a younger age if you are overweight or have a high risk for diabetes. What should I know about preventing infection? Hepatitis B If you have a higher risk for hepatitis B, you should be screened for this virus. Talk with your health care provider to find out if you are at risk for hepatitis B infection. Hepatitis C Blood testing is recommended for:  Everyone born from [birth year] through 1965.  Anyone with known risk factors for hepatitis C. Sexually transmitted infections  (STIs)  You should be screened each year for STIs, including gonorrhea and chlamydia, if: ? You are sexually active and are younger than 40 years of age. ? You are older than 40 years of age and your health care provider tells you that you are at risk for this type of infection. ? Your sexual activity has changed since you were last screened, and you are at increased risk for chlamydia or gonorrhea. Ask your health care provider if you are at risk.  Ask your health care provider about whether you are at high risk for HIV. Your health care provider may recommend a prescription medicine to help prevent HIV infection. If you choose to take medicine to prevent HIV, you should first get tested for HIV. You should then be tested every 3 months for as long as you are taking the medicine. Follow these instructions at home: Lifestyle  Do not use any products that contain nicotine or tobacco, such as cigarettes, e-cigarettes, and chewing tobacco. If you need help quitting, ask your health care provider.  Do not use street drugs.  Do not share needles.  Ask your health care provider for help if you need support or information about quitting drugs. Alcohol use  Do not drink alcohol if your health care provider tells you not to drink.  If you drink alcohol: ? Limit how much you have to 0-2 drinks a day. ? Be aware of how much alcohol is in your drink. In the [place] one drink equals one 12 oz bottle of beer (355 mL), one 5 oz glass of wine (148 mL), or one 1 oz glass of hard liquor (44 mL). General instructions  Schedule regular health, dental, and eye exams.  Stay current with your vaccines.  Tell your health care provider if: ? You often feel depressed. ? You have ever been abused or do not feel safe at home. Summary  Adopting a healthy lifestyle and getting preventive care are important in promoting health and wellness.  Follow your health care provider's instructions about healthy diet,  exercising, and getting tested or screened for diseases.  Follow your health care provider's instructions on monitoring your cholesterol and blood pressure. This information is not intended to replace advice given to you by your health care provider. Make sure you discuss any questions you have with your health care provider. Document Revised: 09/21/2018 Document Reviewed: 09/21/2018 Elsevier Patient Education  2021 [employer].

## 2021-02-10 ENCOUNTER — Encounter: Payer: Self-pay | Admitting: Physician Assistant

## 2021-02-10 LAB — HEPATITIS C ANTIBODY
Hepatitis C Ab: NONREACTIVE
SIGNAL TO CUT-OFF: 0 (ref <1.00)

## 2022-02-10 ENCOUNTER — Encounter: Payer: Self-pay | Admitting: Physician Assistant

## 2022-02-10 ENCOUNTER — Ambulatory Visit (INDEPENDENT_AMBULATORY_CARE_PROVIDER_SITE_OTHER): Payer: 59 | Admitting: Physician Assistant

## 2022-02-10 VITALS — BP 110/80 | HR 68 | Temp 98.0°F | Ht 75.5 in | Wt 220.2 lb

## 2022-02-10 DIAGNOSIS — Z0000 Encounter for general adult medical examination without abnormal findings: Secondary | ICD-10-CM | POA: Diagnosis not present

## 2022-02-10 DIAGNOSIS — E663 Overweight: Secondary | ICD-10-CM | POA: Diagnosis not present

## 2022-02-10 DIAGNOSIS — E785 Hyperlipidemia, unspecified: Secondary | ICD-10-CM

## 2022-02-10 LAB — LIPID PANEL
Cholesterol: 259 mg/dL — ABNORMAL HIGH (ref 0–200)
HDL: 50.4 mg/dL (ref >39.00)
LDL Cholesterol: 176 mg/dL — ABNORMAL HIGH (ref 0–99)
NonHDL: 209.05
Total CHOL/HDL Ratio: 5
Triglycerides: 164 mg/dL — ABNORMAL HIGH (ref 0.0–149.0)
VLDL: 32.8 mg/dL (ref 0.0–40.0)

## 2022-02-10 LAB — COMPREHENSIVE METABOLIC PANEL
ALT: 28 U/L (ref 0–53)
AST: 23 U/L (ref 0–37)
Albumin: 4.5 g/dL (ref 3.5–5.2)
Alkaline Phosphatase: 71 U/L (ref 39–117)
BUN: 14 mg/dL (ref 6–23)
CO2: 28 mEq/L (ref 19–32)
Calcium: 9.2 mg/dL (ref 8.4–10.5)
Chloride: 103 mEq/L (ref 96–112)
Creatinine, Ser: 0.97 mg/dL (ref 0.40–1.50)
GFR: 97.62 mL/min (ref >60.00)
Glucose, Bld: 96 mg/dL (ref 70–99)
Potassium: 4.9 mEq/L (ref 3.5–5.1)
Sodium: 139 mEq/L (ref 135–145)
Total Bilirubin: 0.4 mg/dL (ref 0.2–1.2)
Total Protein: 7.6 g/dL (ref 6.0–8.3)

## 2022-02-10 LAB — CBC WITH DIFFERENTIAL/PLATELET
Basophils Absolute: 0 10*3/uL (ref 0.0–0.1)
Basophils Relative: 0.6 % (ref 0.0–3.0)
Eosinophils Absolute: 0.1 10*3/uL (ref 0.0–0.7)
Eosinophils Relative: 2.1 % (ref 0.0–5.0)
HCT: 46 % (ref 39.0–52.0)
Hemoglobin: 15.3 g/dL (ref 13.0–17.0)
Lymphocytes Relative: 27.9 % (ref 12.0–46.0)
Lymphs Abs: 1.6 10*3/uL (ref 0.7–4.0)
MCHC: 33.2 g/dL (ref 30.0–36.0)
MCV: 88.7 fl (ref 78.0–100.0)
Monocytes Absolute: 0.6 10*3/uL (ref 0.1–1.0)
Monocytes Relative: 11 % (ref 3.0–12.0)
Neutro Abs: 3.4 10*3/uL (ref 1.4–7.7)
Neutrophils Relative %: 58.4 % (ref 43.0–77.0)
Platelets: 287 10*3/uL (ref 150.0–400.0)
RBC: 5.19 Mil/uL (ref 4.22–5.81)
RDW: 13 % (ref 11.5–15.5)
WBC: 5.8 10*3/uL (ref 4.0–10.5)

## 2022-02-10 NOTE — Progress Notes (Signed)
? ?Subjective:  ?  ?Lucas Hoover. is a 41 y.o. male and is here for a comprehensive physical exam. ? ?HPI ? ?Health Maintenance Due  ?Topic Date Due  ? TETANUS/TDAP  Never done  ? ? ?Acute Concerns: ?None reported. ? ?Chronic Issues: ?HLD ?Following previous labs completed on 02/07/21, pt was found to have an LDL of 189 and cholesterol of 264. Pt was not started on mediation at this time due to wanting to work on his lifestyle to improve levels first. At this time he interested in re-checking these levels and is interested in completing a cardiac calcium score, especially due to fhx of heart disease. Denies CP or SOB.  ? ?Health Maintenance: ?Immunizations -- Covid- Declined ?Influenza- Declined ?Tdap-Due ?Colonoscopy -- N/A ?PSA -- No results found for: PSA1, PSA ?Diet -- Eats all food groups--tries to decrease fast food intake  ?Sleep habits -- No concerns despite son's night terrors ?Exercise -- Participates in disk golf every weekend;4-5 miles walking ?Weight -- Stable ?Weight history ?Wt Readings from Last 10 Encounters:  ?02/10/22 220 lb 4 oz (99.9 kg)  ?02/07/21 216 lb 4 oz (98.1 kg)  ?11/18/20 215 lb (97.5 kg)  ?03/08/19 185 lb (83.9 kg)  ?10/31/18 186 lb 4 oz (84.5 kg)  ?09/09/17 201 lb 12.8 oz (91.5 kg)  ?07/19/17 199 lb (90.3 kg)  ? ?Body mass index is 27.17 kg/m?. ?Mood -- Stable ?Tobacco use --  ?Tobacco Use: Low Risk   ? Smoking Tobacco Use: Never  ? Smokeless Tobacco Use: Never  ? Passive Exposure: Not on file  ?  ?Alcohol use ---  reports current alcohol use of about 3.0 standard drinks per week.  ? ? ?  02/10/2022  ?  8:01 AM  ?Depression screen PHQ 2/9  ?Decreased Interest 0  ?Down, Depressed, Hopeless 0  ?PHQ - 2 Score 0  ? ? ? ?Other providers/specialists: ?Patient Care Team: ?Jarold Motto, PA as PCP - General (Physician Assistant) ? ? ?PMHx, SurgHx, SocialHx, Medications, and Allergies were reviewed in the Visit Navigator and updated as appropriate.  ? ?History reviewed. No  pertinent past medical history. ? ? ?Past Surgical History:  ?Procedure Laterality Date  ? LUMBAR DISC ARTHROPLASTY    ? ? ? ?Family History  ?Problem Relation Age of Onset  ? Thyroid disease Mother   ? Hypertension Mother   ? Hyperlipidemia Mother   ? Hyperlipidemia Father   ? Breast cancer Paternal Grandmother   ? Stroke Maternal Grandfather   ? COPD Maternal Grandmother   ? Obesity Brother   ? Colon cancer Neg Hx   ? Prostate cancer Neg Hx   ? ? ?Social History  ? ?Tobacco Use  ? Smoking status: Never  ? Smokeless tobacco: Never  ?Vaping Use  ? Vaping Use: Never used  ?Substance Use Topics  ? Alcohol use: Yes  ?  Alcohol/week: 3.0 standard drinks  ?  Types: 3 Standard drinks or equivalent per week  ? Drug use: No  ? ? ?Review of Systems:  ? ?Review of Systems  ?Constitutional:  Negative for chills, fever, malaise/fatigue and weight loss.  ?HENT:  Negative for hearing loss, sinus pain and sore throat.   ?Respiratory:  Negative for cough and hemoptysis.   ?Cardiovascular:  Negative for chest pain, palpitations, leg swelling and PND.  ?Gastrointestinal:  Negative for abdominal pain, constipation, diarrhea, heartburn, nausea and vomiting.  ?Genitourinary:  Negative for dysuria, frequency and urgency.  ?Musculoskeletal:  Negative for back pain, myalgias and  neck pain.  ?Skin:  Negative for itching and rash.  ?Neurological:  Negative for dizziness, tingling, seizures and headaches.  ?Endo/Heme/Allergies:  Negative for polydipsia.  ?Psychiatric/Behavioral:  Negative for depression. The patient is not nervous/anxious.   ? ?Objective:  ? ?Vitals:  ? 02/10/22 0801  ?BP: 110/80  ?Pulse: 68  ?Temp: 98 ?F (36.7 ?C)  ?SpO2: 98%  ? ?Body mass index is 27.17 kg/m?. ? ?General Appearance:  Alert, cooperative, no distress, appears stated age  ?Head:  Normocephalic, without obvious abnormality, atraumatic  ?Eyes:  PERRL, conjunctiva/corneas clear, EOM's intact, fundi benign, both eyes       ?Ears:  Normal TM's and external ear  canals, both ears  ?Nose: Nares normal, septum midline, mucosa normal, no drainage    or sinus tenderness  ?Throat: Lips, mucosa, and tongue normal; teeth and gums normal  ?Neck: Supple, symmetrical, trachea midline, no adenopathy; thyroid:  No enlargement/tenderness/nodules; no carotit bruit or JVD  ?Back:   Symmetric, no curvature, ROM normal, no CVA tenderness  ?Lungs:   Clear to auscultation bilaterally, respirations unlabored  ?Chest wall:  No tenderness or deformity  ?Heart:  Regular rate and rhythm, S1 and S2 normal, no murmur, rub   or gallop  ?Abdomen:   Soft, non-tender, bowel sounds active all four quadrants, no masses, no organomegaly  ?Extremities: Extremities normal, atraumatic, no cyanosis or edema  ?Prostate: Not done.   ?Skin: Skin color, texture, turgor normal, no rashes or lesions  ?Lymph nodes: Cervical, supraclavicular, and axillary nodes normal  ?Neurologic: CNII-XII grossly intact. Normal strength, sensation and reflexes throughout  ? ? ?Assessment/Plan:  ? ?Routine physical examination ?Today patient counseled on age appropriate routine health concerns for screening and prevention, each reviewed and up to date or declined. Immunizations reviewed and up to date or declined. Labs ordered and reviewed. Risk factors for depression reviewed and negative. Hearing function and visual acuity are intact. ADLs screened and addressed as needed. Functional ability and level of safety reviewed and appropriate. Education, counseling and referrals performed based on assessed risks today. Patient provided with a copy of personalized plan for preventive services. ? ? ?Overweight ?Encouraged patient to continue participating in healthy eating and daily exercise ? ? ?Hyperlipidemia, unspecified hyperlipidemia type ?Update lipid panel, will start medication as indicated by results  ?Ordered Calcium Score for further evaluation per patient's request  ? ? ?Patient Counseling: ?[x]   Nutrition: Stressed importance  of moderation in sodium/caffeine intake, saturated fat and cholesterol, caloric balance, sufficient intake of fresh fruits, vegetables, and fiber.  ?[x]   Stressed the importance of regular exercise.   ?[]   Substance Abuse: Discussed cessation/primary prevention of tobacco, alcohol, or other drug use; driving or other dangerous activities under the influence; availability of treatment for abuse.   ?[x]   Injury prevention: Discussed safety belts, safety helmets, smoke detector, smoking near bedding or upholstery.   ?[]   Sexuality: Discussed sexually transmitted diseases, partner selection, use of condoms, avoidance of unintended pregnancy  and contraceptive alternatives.   ?[x]   Dental health: Discussed importance of regular tooth brushing, flossing, and dental visits.  ?[x]   Health maintenance and immunizations reviewed. Please refer to Health maintenance section.  ?  ?I,Havlyn C Ratchford,acting as a scribe for , PA.,have documented all relevant documentation on the behalf of , PA,as directed by  , PA while in the presence of , . ? ?I , PA, have reviewed all documentation for this visit. The documentation on 02/10/22 for the exam, diagnosis,  procedures, and orders are all accurate and complete. ? ? ?Jarold MottoSamantha Meghanne Pletz, PA-C ?[facility] ? ? ? ? ? ? ? ?

## 2022-02-10 NOTE — Patient Instructions (Signed)
It was great to see you! ? ?We will order calcium scan and you will be contacted to have this done. ? ?Please go to the lab for blood work.  ? ?Our office will call you with your results unless you have chosen to receive results via MyChart. ? ?If your blood work is normal we will follow-up each year for physicals and as scheduled for chronic medical problems. ? ?If anything is abnormal we will treat accordingly and get you in for a follow-up. ? ?Take care, ? ?[facility] ?  ?

## 2022-02-11 ENCOUNTER — Encounter: Payer: Self-pay | Admitting: Physician Assistant

## 2022-02-12 ENCOUNTER — Other Ambulatory Visit: Payer: Self-pay | Admitting: Physician Assistant

## 2022-02-12 MED ORDER — ATORVASTATIN CALCIUM 10 MG PO TABS: 10.0000 mg | ORAL_TABLET | Freq: Every day | ORAL | 3 refills | Status: DC

## 2022-02-12 NOTE — Telephone Encounter (Signed)
Please see message. I have updated COVID vaccines in chart. ?

## 2022-03-24 ENCOUNTER — Ambulatory Visit (INDEPENDENT_AMBULATORY_CARE_PROVIDER_SITE_OTHER): Payer: 59

## 2022-03-24 DIAGNOSIS — Z23 Encounter for immunization: Secondary | ICD-10-CM | POA: Diagnosis not present

## 2022-03-31 ENCOUNTER — Ambulatory Visit
Admission: RE | Admit: 2022-03-31 | Discharge: 2022-03-31 | Disposition: A | Discharge status: Home / Self Care | Payer: Self-pay | Source: Ambulatory Visit | Attending: Physician Assistant | Admitting: Physician Assistant

## 2022-03-31 DIAGNOSIS — E785 Hyperlipidemia, unspecified: Secondary | ICD-10-CM

## 2022-04-01 ENCOUNTER — Other Ambulatory Visit: Payer: Self-pay | Admitting: Physician Assistant

## 2022-04-01 ENCOUNTER — Encounter: Payer: Self-pay | Admitting: Physician Assistant

## 2022-04-01 DIAGNOSIS — E785 Hyperlipidemia, unspecified: Secondary | ICD-10-CM

## 2022-04-01 DIAGNOSIS — R918 Other nonspecific abnormal finding of lung field: Secondary | ICD-10-CM

## 2022-04-02 ENCOUNTER — Telehealth: Payer: Self-pay | Admitting: Physician Assistant

## 2022-04-02 NOTE — Telephone Encounter (Signed)
Tranika from [employer] called in regards to patient's recently ordered CT Calcium score. She states that they will need the CPT code for this scan. The CPT code can be provided to the company via [phone] using the reference number, [phone]. The representative also said the patient can be given the CPT code and give it to [facility].

## 2022-04-02 NOTE — Telephone Encounter (Signed)
Spoke to pt told him I received a call from your insurance asked for CPT code for your CT Calcium score test. Asked pt if he paid out of pocket due to not covered by insurance. Pt said yes he did. Told pt okay, and looks like a CT scan of your chest is ordered. Pt said yes, but not to be done for 3 months. Told pt okay if you find out you need something from me , just let us know. Pt verbalized understanding.

## 2022-07-02 ENCOUNTER — Ambulatory Visit
Admission: RE | Admit: 2022-07-02 | Discharge: 2022-07-02 | Disposition: A | Discharge status: Home / Self Care | Payer: 59 | Source: Ambulatory Visit | Attending: Physician Assistant | Admitting: Physician Assistant

## 2022-07-02 DIAGNOSIS — R918 Other nonspecific abnormal finding of lung field: Secondary | ICD-10-CM

## 2022-07-17 ENCOUNTER — Other Ambulatory Visit: Payer: Self-pay | Admitting: Physician Assistant

## 2022-07-17 ENCOUNTER — Other Ambulatory Visit (INDEPENDENT_AMBULATORY_CARE_PROVIDER_SITE_OTHER): Payer: 59

## 2022-07-17 ENCOUNTER — Encounter: Payer: Self-pay | Admitting: Physician Assistant

## 2022-07-17 DIAGNOSIS — E785 Hyperlipidemia, unspecified: Secondary | ICD-10-CM | POA: Diagnosis not present

## 2022-07-17 LAB — LIPID PANEL
Cholesterol: 204 mg/dL — ABNORMAL HIGH (ref 0–200)
HDL: 51.3 mg/dL (ref >39.00)
LDL Cholesterol: 127 mg/dL — ABNORMAL HIGH (ref 0–99)
NonHDL: 153.05
Total CHOL/HDL Ratio: 4
Triglycerides: 129 mg/dL (ref 0.0–149.0)
VLDL: 25.8 mg/dL (ref 0.0–40.0)

## 2022-07-17 MED ORDER — ATORVASTATIN CALCIUM 20 MG PO TABS: 20.0000 mg | ORAL_TABLET | Freq: Every day | ORAL | 1 refills | Status: DC

## 2022-07-30 ENCOUNTER — Telehealth: Payer: Self-pay | Admitting: Physician Assistant

## 2022-07-30 ENCOUNTER — Other Ambulatory Visit: Payer: Self-pay | Admitting: Physician Assistant

## 2022-07-30 MED ORDER — PRAVASTATIN SODIUM 40 MG PO TABS: 40.0000 mg | ORAL_TABLET | Freq: Every day | ORAL | 1 refills | Status: DC

## 2022-07-30 NOTE — Telephone Encounter (Signed)
Called pharmacy spoke to [place] told him pt is suppose to start Pravastatin 40 mg and you can discontinue Atorvastatin. Lane verbalized understanding.

## 2022-07-30 NOTE — Telephone Encounter (Signed)
Lucas Hoover with [facility] requests to be called at ph# [phone] re: clarification on RX received for   pravastatin (PRAVACHOL) 40 MG tablet  States Patient picked up RX for atorvastatin on 07/19/22  States Patient should not be taking both of the above medications

## 2022-07-31 ENCOUNTER — Telehealth: Payer: Self-pay | Admitting: Physician Assistant

## 2022-07-31 ENCOUNTER — Ambulatory Visit: Payer: 59 | Admitting: Family Medicine

## 2022-07-31 VITALS — BP 130/100 | HR 72 | Temp 98.3°F | Ht 76.0 in | Wt 223.8 lb

## 2022-07-31 DIAGNOSIS — R079 Chest pain, unspecified: Secondary | ICD-10-CM

## 2022-07-31 NOTE — Telephone Encounter (Signed)
Caller states: -Patient has been experiencing left-sided chest pain for 3 days  - Describes it as a muscle ache - No other sx  - Worsens upon lying down   Patient was confirmed to be beside caller. Pt has been transferred to triage.

## 2022-07-31 NOTE — Progress Notes (Signed)
Established Patient Office Visit  Subjective   Patient ID: Lucas Hoover., male    DOB: Jan 23, 1981  Age: 41 y.o. MRN: [id number]  Chief Complaint  Patient presents with   Chest Pain    Pt reports Sx started 4 days ago after taking lipitor 20 mg. Discontinued Lipitor. Sx worsen when laying down. Denied palpitation, SOB, pain radiation. Currently pt reports he is having a dull chest pain.     HPI   Lucas Hoover is seen as a work in with atypical left-sided chest pain.  He states he was recently started on Lipitor and took this for 3 to 4 months but developed some significant myalgias.  He was just switched over yesterday to pravastatin 40 mg daily.  He has some soreness in the left chest wall area which is exacerbated somewhat with certain movements and positions lying in bed.  Denies any injury.  No exertional chest pain.  No dyspnea.  No nausea or vomiting.  No diaphoresis.  No left arm pain or left neck pain.  He is a non-smoker.  No family history of premature CAD.  No history of diabetes.  Blood pressure slightly up today here in office but generally well controlled.  He had coronary calcium score in June of 9 and also CT chest a month ago that showed only 0.2 cm nodule right upper lobe patient is low risk is a non-smoker.  No reported coughing.  No past medical history on file. Past Surgical History:  Procedure Laterality Date   LUMBAR DISC ARTHROPLASTY      reports that he has never smoked. He has never used smokeless tobacco. He reports current alcohol use of about 3.0 standard drinks of alcohol per week. He reports that he does not use drugs. family history includes Breast cancer in his paternal grandmother; COPD in his maternal grandmother; Hyperlipidemia in his father and mother; Hypertension in his mother; Obesity in his brother; Stroke in his maternal grandfather; Thyroid disease in his mother. No Known Allergies  The 10-year ASCVD risk score (Arnett DK, et al., 2019)  is: 1.3%   Values used to calculate the score:     Age: 41 years     Sex: Male     Is Non-Hispanic African American: No     Diabetic: No     Tobacco smoker: No     Systolic Blood Pressure: 557 mmHg     Is BP treated: No     HDL Cholesterol: 51.3 mg/dL     Total Cholesterol: 204 mg/dL  Review of Systems  Constitutional:  Negative for chills, fever and malaise/fatigue.  Eyes:  Negative for blurred vision.  Respiratory:  Negative for cough and shortness of breath.   Cardiovascular:  Positive for chest pain. Negative for palpitations, orthopnea, leg swelling and PND.  Neurological:  Negative for dizziness, weakness and headaches.      Objective:     BP (!) 130/100 (BP Location: Left Arm, Patient Position: Sitting, Cuff Size: Normal)   Pulse 72   Temp 98.3 F (36.8 C) (Oral)   Ht 6\' 4"  (1.93 m)   Wt 223 lb 12.8 oz (101.5 kg)   SpO2 98%   BMI 27.24 kg/m  BP Readings from Last 3 Encounters:  07/31/22 (!) 130/100  02/10/22 110/80  02/07/21 126/82   Wt Readings from Last 3 Encounters:  07/31/22 223 lb 12.8 oz (101.5 kg)  02/10/22 220 lb 4 oz (99.9 kg)  02/07/21 216 lb 4 oz (98.1 kg)  Physical Exam Vitals reviewed.  Constitutional:      Appearance: He is well-developed.  HENT:     Right Ear: External ear normal.     Left Ear: External ear normal.  Eyes:     Pupils: Pupils are equal, round, and reactive to light.  Neck:     Thyroid: No thyromegaly.  Cardiovascular:     Rate and Rhythm: Normal rate and regular rhythm.  Pulmonary:     Effort: Pulmonary effort is normal. No respiratory distress.     Breath sounds: Normal breath sounds. No wheezing or rales.  Chest:     Chest wall: No edema. There is no dullness to percussion.  Neurological:     Mental Status: He is alert and oriented to person, place, and time.      No results found for any visits on 07/31/22.  Last metabolic panel Lab Results  Component Value Date   GLUCOSE 96 02/10/2022   NA 139  02/10/2022   K 4.9 02/10/2022   CL 103 02/10/2022   CO2 28 02/10/2022   BUN 14 02/10/2022   CREATININE 0.97 02/10/2022   CALCIUM 9.2 02/10/2022   PROT 7.6 02/10/2022   ALBUMIN 4.5 02/10/2022   BILITOT 0.4 02/10/2022   ALKPHOS 71 02/10/2022   AST 23 02/10/2022   ALT 28 02/10/2022   Last lipids Lab Results  Component Value Date   CHOL 204 (H) 07/17/2022   HDL 51.30 07/17/2022   LDLCALC 127 (H) 07/17/2022   TRIG 129.0 07/17/2022   CHOLHDL 4 07/17/2022      The 23-FTDD ASCVD risk score (Arnett DK, et al., 2019) is: 1.3%    Assessment & Plan:   Problem List Items Addressed This Visit   None Visit Diagnoses     Chest pain, unspecified type    -  Primary   Relevant Orders   EKG 12-Lead     Patient presents with atypical chest pain.  Atypical features including duration (hours), sore quality, worse with movement, nonexertional, etc. He did bring up question whether this could be related to myalgias from Lipitor.  Recently switched to pravastatin just yesterday.  Obtain EKG. suspect this represents musculoskeletal noncardiac chest pain -EKG shows sinus rhythm with no acute ST-T changes. -In view of his relatively normal EKG today, atypical symptoms, and recent low coronary calcium score do not feel like he needs further cardiac evaluation at this time. -Follow-up promptly for any exertional symptoms.  We reviewed signs and symptoms of more typical angina to watch out for.    No follow-ups on file.    Evelena Peat, MD

## 2022-07-31 NOTE — Telephone Encounter (Signed)
Noted. Pt scheduled to see Dr. Elease Hashimoto today at 10:45 AM.

## 2022-07-31 NOTE — Telephone Encounter (Signed)
Patient Name: Lucas Hoover [facility] Gender: Male DOB: 1981-01-11 Age: 41 Y 6 D Return Phone Number: [phone] (Primary) Address: City/ State/ Zip: [place]  [address] Client [facility] - Administrator, sports at [facility] Provider Bufford Buttner, [place]- [place] Contact Type Call Who Is Calling Patient / Member / Family / Caregiver Call Type Triage / Clinical Relationship To Patient Self Return Phone Number [phone] (Primary) Chief Complaint CHEST PAIN - pain, pressure, heaviness or tightness Reason for Call Symptomatic / Request for Health Information Initial Comment Caller states pt is experiencing chest pain on the left side. Translation No Nurse Assessment Nurse: Freida Busman, RN, Jonette Eva Date/Time (Eastern Time): 07/31/2022 8:17:00 AM Confirm and document reason for call. If symptomatic, describe symptoms. ---Caller states patient having left side chest pain denies shortness of breath has not checked b/p this morning no hx of htn pain increases with lying down has recently increased his cholesterol medication md aware of muscle aches Does the patient have any new or worsening symptoms? ---Yes Will a triage be completed? ---Yes Related visit to physician within the last 2 weeks? ---No Does the PT have any chronic conditions? (i.e. diabetes, asthma, this includes High risk factors for pregnancy, etc.) ---Yes List chronic conditions. ---cholesterol Is this a behavioral health or substance abuse call? ---No Guidelines Guideline Title Affirmed Question Affirmed Notes Nurse Date/Time Lamount Cohen Time) Chest Pain [1] Chest pain lasts < 5 minutes AND [2] NO chest pain or cardiac symptoms (e.g., breathing difficulty, sweating) Freida Busman, RN, Jonette Eva 07/31/2022 8:20:01 AM PLEASE NOTE: All timestamps contained within this report are represented as [place] Standard Time. CONFIDENTIALTY NOTICE: This fax transmission is intended  only for the addressee. It contains information that is legally privileged, confidential or otherwise protected from use or disclosure. If you are not the intended recipient, you are strictly prohibited from reviewing, disclosing, copying using or disseminating any of this information or taking any action in reliance on or regarding this information. If you have received this fax in error, please notify us immediately by telephone so that we can arrange for its return to [place]. Phone: [phone], Toll-Free: [phone], Fax: [phone] Page: 2 of 2 Call Id: [id number] Guidelines Guideline Title Affirmed Question Affirmed Notes Nurse Date/Time Lamount Cohen Time) now (Exception: Chest pains that last only a few seconds.) Disp. Time Lamount Cohen Time) Disposition Final User 07/31/2022 8:13:52 AM Send to Urgent Verlin Dike 07/31/2022 8:24:53 AM Go to ED Now (or PCP triage) Yes Freida Busman, RN, Jonette Eva Final Disposition 07/31/2022 8:24:53 AM Go to ED Now (or PCP triage) Yes Freida Busman, RN, Jonette Eva Disposition Overriden: See PCP within 24 Hours Override Reason: Patient's symptoms need a higher level of care Caller Disagree/Comply Comply Caller Understands Yes PreDisposition Call Doctor Care Advice Given Per Guideline GO TO ED NOW (OR PCP TRIAGE): * IF PCP SECOND-LEVEL TRIAGE REQUIRED: You may need to be seen. Your doctor (or NP/PA) will want to talk with you to decide what's best. I'll page the provider on-call now. If you haven't heard from the provider (or me) within 30 minutes, go directly to the ED/UCC at _____________ Hospital. CARE ADVICE given per Chest Pain (Adult) guideline. ANOTHER ADULT SHOULD DRIVE: * It is better and safer if another adult drives instead of you. CALL EMS IF: * Severe difficulty breathing occurs * Passes out or becomes too weak to stand * You become worse Comments User: Felipe Drone, RN Date/Time Lamount Cohen Time): 07/31/2022 8:26:21 AM Patient given 4hour triage per  nurseing judgement and s/s Referrals GO TO FACILITY UNDECIDED

## 2022-11-04 ENCOUNTER — Encounter: Payer: Self-pay | Admitting: Physician Assistant

## 2022-11-04 ENCOUNTER — Ambulatory Visit: Payer: 59 | Admitting: Physician Assistant

## 2022-11-04 VITALS — BP 120/78 | HR 80 | Temp 98.2°F | Ht 76.0 in | Wt 222.2 lb

## 2022-11-04 DIAGNOSIS — J029 Acute pharyngitis, unspecified: Secondary | ICD-10-CM | POA: Diagnosis not present

## 2022-11-04 LAB — POCT RAPID STREP A (OFFICE): Rapid Strep A Screen: POSITIVE — AB

## 2022-11-04 MED ORDER — AMOXICILLIN 500 MG PO CAPS: 500.0000 mg | ORAL_CAPSULE | Freq: Two times a day (BID) | ORAL | 0 refills | Status: AC

## 2022-11-04 NOTE — Progress Notes (Signed)
Lucas Hoover. is a 42 y.o. male here for a new problem.  History of Present Illness:   Chief Complaint  Patient presents with   Sore Throat    C/o sore throat started last night. Denies fever or chills.    HPI  Sore Throat He reports experiencing sore throat that started 11/03/2022. He reports no exposure to any sick people recently.  Denies: chest pain, neck stiffness, fevers, chills, SOB, n/v/d  History reviewed. No pertinent past medical history.   Social History   Tobacco Use   Smoking status: Never   Smokeless tobacco: Never  Vaping Use   Vaping Use: Never used  Substance Use Topics   Alcohol use: Yes    Alcohol/week: 3.0 standard drinks of alcohol    Types: 3 Standard drinks or equivalent per week   Drug use: No    Past Surgical History:  Procedure Laterality Date   LUMBAR DISC ARTHROPLASTY      Family History  Problem Relation Age of Onset   Thyroid disease Mother    Hypertension Mother    Hyperlipidemia Mother    Hyperlipidemia Father    Obesity Brother    COPD Maternal Grandmother    Stroke Maternal Grandfather        before age 50   Breast cancer Paternal Grandmother    Colon cancer Neg Hx    Prostate cancer Neg Hx     No Known Allergies  Current Medications:   Current Outpatient Medications:    amoxicillin (AMOXIL) 500 MG capsule, Take 1 capsule (500 mg total) by mouth 2 (two) times daily for 10 days., Disp: 20 capsule, Rfl: 0   pravastatin (PRAVACHOL) 40 MG tablet, Take 1 tablet (40 mg total) by mouth daily., Disp: 90 tablet, Rfl: 1   Review of Systems:   Review of Systems  Constitutional:  Negative for chills, fever and malaise/fatigue.  HENT:  Positive for sore throat. Negative for congestion and ear pain.   Eyes:  Negative for blurred vision.  Respiratory:  Negative for cough and shortness of breath.   Cardiovascular:  Negative for chest pain, palpitations and leg swelling.  Gastrointestinal:  Negative for diarrhea and  vomiting.  Musculoskeletal:  Negative for back pain.  Skin:  Negative for rash.  Neurological:  Negative for loss of consciousness and headaches.    Vitals:   Vitals:   11/04/22 1354  BP: 120/78  Pulse: 80  Temp: 98.2 F (36.8 C)  TempSrc: Temporal  SpO2: 97%  Weight: 222 lb 4 oz (100.8 kg)  Height: 6\' 4"  (1.93 m)     Body mass index is 27.05 kg/m.  Physical Exam:   Physical Exam Vitals and nursing note reviewed.  Constitutional:      General: He is not in acute distress.    Appearance: He is well-developed. He is not ill-appearing or toxic-appearing.  HENT:     Head: Normocephalic and atraumatic.     Right Ear: Tympanic membrane, ear canal and external ear normal. Tympanic membrane is not erythematous, retracted or bulging.     Left Ear: Tympanic membrane, ear canal and external ear normal. Tympanic membrane is not erythematous, retracted or bulging.     Nose: Nose normal.     Right Sinus: No maxillary sinus tenderness or frontal sinus tenderness.     Left Sinus: No maxillary sinus tenderness or frontal sinus tenderness.     Mouth/Throat:     Pharynx: Uvula midline. Posterior oropharyngeal erythema present.  Tonsils: 1+ on the right. 1+ on the left.  Eyes:     General: Lids are normal.     Conjunctiva/sclera: Conjunctivae normal.  Neck:     Trachea: Trachea normal.  Cardiovascular:     Rate and Rhythm: Normal rate and regular rhythm.     Pulses: Normal pulses.     Heart sounds: Normal heart sounds, S1 normal and S2 normal.  Pulmonary:     Effort: Pulmonary effort is normal.     Breath sounds: Normal breath sounds. No decreased breath sounds, wheezing, rhonchi or rales.  Lymphadenopathy:     Cervical: No cervical adenopathy.  Skin:    General: Skin is warm and dry.  Neurological:     Mental Status: He is alert.     GCS: GCS eye subscore is 4. GCS verbal subscore is 5. GCS motor subscore is 6.  Psychiatric:        Speech: Speech normal.        Behavior:  Behavior normal. Behavior is cooperative.    Results for orders placed or performed in visit on 11/04/22  POCT rapid strep A  Result Value Ref Range   Rapid Strep A Screen Positive (A) Negative    Assessment and Plan:   Sore throat No red flags on exam.  Will initiate amoxicillin 500 mg bid per orders. Discussed taking medications as prescribed. Reviewed return precautions including worsening fever, SOB, worsening cough or other concerns. Push fluids and rest. I recommend that patient follow-up if symptoms worsen or persist despite treatment x 7-10 days, sooner if needed.     I,Alexander Ruley,acting as a Education administrator for [employer], PA.,have documented all relevant documentation on the behalf of Inda Coke, PA,as directed by  Inda Coke, PA while in the presence of Inda Coke, [place].   I, Inda Coke, [place], have reviewed all documentation for this visit. The documentation on 11/04/22 for the exam, diagnosis, procedures, and orders are all accurate and complete.    Inda Coke, PA-C

## 2023-01-12 ENCOUNTER — Encounter: Payer: Self-pay | Admitting: Physician Assistant

## 2023-01-12 DIAGNOSIS — E785 Hyperlipidemia, unspecified: Secondary | ICD-10-CM

## 2023-02-12 ENCOUNTER — Encounter: Payer: 59 | Admitting: Physician Assistant

## 2023-02-15 ENCOUNTER — Encounter: Payer: Self-pay | Admitting: Physician Assistant

## 2023-02-15 ENCOUNTER — Ambulatory Visit (INDEPENDENT_AMBULATORY_CARE_PROVIDER_SITE_OTHER): Payer: 59 | Admitting: Physician Assistant

## 2023-02-15 VITALS — BP 120/80 | HR 61 | Temp 98.0°F | Ht 76.0 in | Wt 222.2 lb

## 2023-02-15 DIAGNOSIS — Z0000 Encounter for general adult medical examination without abnormal findings: Secondary | ICD-10-CM

## 2023-02-15 DIAGNOSIS — E663 Overweight: Secondary | ICD-10-CM

## 2023-02-15 DIAGNOSIS — E785 Hyperlipidemia, unspecified: Secondary | ICD-10-CM | POA: Diagnosis not present

## 2023-02-15 LAB — LIPID PANEL
Cholesterol: 162 mg/dL (ref 0–200)
HDL: 43.4 mg/dL (ref >39.00)
LDL Cholesterol: 93 mg/dL (ref 0–99)
NonHDL: 118.5
Total CHOL/HDL Ratio: 4
Triglycerides: 130 mg/dL (ref 0.0–149.0)
VLDL: 26 mg/dL (ref 0.0–40.0)

## 2023-02-15 LAB — CBC WITH DIFFERENTIAL/PLATELET
Basophils Absolute: 0 10*3/uL (ref 0.0–0.1)
Basophils Relative: 0.6 % (ref 0.0–3.0)
Eosinophils Absolute: 0.1 10*3/uL (ref 0.0–0.7)
Eosinophils Relative: 1.3 % (ref 0.0–5.0)
HCT: 45.6 % (ref 39.0–52.0)
Hemoglobin: 15.4 g/dL (ref 13.0–17.0)
Lymphocytes Relative: 27.1 % (ref 12.0–46.0)
Lymphs Abs: 1.4 10*3/uL (ref 0.7–4.0)
MCHC: 33.7 g/dL (ref 30.0–36.0)
MCV: 87.9 fl (ref 78.0–100.0)
Monocytes Absolute: 0.5 10*3/uL (ref 0.1–1.0)
Monocytes Relative: 10.7 % (ref 3.0–12.0)
Neutro Abs: 3.1 10*3/uL (ref 1.4–7.7)
Neutrophils Relative %: 60.3 % (ref 43.0–77.0)
Platelets: 283 10*3/uL (ref 150.0–400.0)
RBC: 5.19 Mil/uL (ref 4.22–5.81)
RDW: 13.4 % (ref 11.5–15.5)
WBC: 5.1 10*3/uL (ref 4.0–10.5)

## 2023-02-15 LAB — COMPREHENSIVE METABOLIC PANEL
ALT: 23 U/L (ref 0–53)
AST: 19 U/L (ref 0–37)
Albumin: 4.4 g/dL (ref 3.5–5.2)
Alkaline Phosphatase: 70 U/L (ref 39–117)
BUN: 12 mg/dL (ref 6–23)
CO2: 29 mEq/L (ref 19–32)
Calcium: 9.2 mg/dL (ref 8.4–10.5)
Chloride: 103 mEq/L (ref 96–112)
Creatinine, Ser: 0.88 mg/dL (ref 0.40–1.50)
GFR: 106.77 mL/min (ref >60.00)
Glucose, Bld: 94 mg/dL (ref 70–99)
Potassium: 4.3 mEq/L (ref 3.5–5.1)
Sodium: 140 mEq/L (ref 135–145)
Total Bilirubin: 0.4 mg/dL (ref 0.2–1.2)
Total Protein: 7 g/dL (ref 6.0–8.3)

## 2023-02-15 NOTE — Patient Instructions (Signed)
It was great to see you! ? ?Please go to the lab for blood work.  ? ?Our office will call you with your results unless you have chosen to receive results via MyChart. ? ?If your blood work is normal we will follow-up each year for physicals and as scheduled for chronic medical problems. ? ?If anything is abnormal we will treat accordingly and get you in for a follow-up. ? ?Take care, ? ?Ricka Westra ?  ? ? ?

## 2023-02-15 NOTE — Progress Notes (Signed)
Subjective:    Lucas Hoover. is a 42 y.o. male and is here for a comprehensive physical exam.  HPI  There are no preventive care reminders to display for this patient.  Acute Concerns: None  Chronic Issues: HLD He had calcium score completed last year, total score of 9.31 We downgraded his Lipitor to pravastatin 40 mg and he seems to be tolerating well  Health Maintenance: Immunizations -- UTD Colonoscopy -- n/a PSA -- No results found for: "PSA1", "PSA" Diet -- eating fairly balanced Sleep habits -- no major concerns -- around 7 hours; no concerns for sleep apnea Exercise -- playing disc golf weekly, occasional heavy lifting  Weight -- Weight: 222 lb 4 oz (100.8 kg)  Recent weight history Wt Readings from Last 10 Encounters:  02/15/23 222 lb 4 oz (100.8 kg)  11/04/22 222 lb 4 oz (100.8 kg)  07/31/22 223 lb 12.8 oz (101.5 kg)  02/10/22 220 lb 4 oz (99.9 kg)  02/07/21 216 lb 4 oz (98.1 kg)  11/18/20 215 lb (97.5 kg)  03/08/19 185 lb (83.9 kg)  10/31/18 186 lb 4 oz (84.5 kg)  09/09/17 201 lb 12.8 oz (91.5 kg)  07/19/17 199 lb (90.3 kg)   Body mass index is 27.05 kg/m.  Mood -- well controlled overall Alcohol use --  reports current alcohol use of about 3.0 standard drinks of alcohol per week.  Tobacco use --  Tobacco Use: Low Risk  (02/15/2023)   Patient History    Smoking Tobacco Use: Never    Smokeless Tobacco Use: Never    Passive Exposure: Not on file    Eligible for Low Dose CT? No  UTD with eye doctor? yes UTD with dentist? Yes UpToDate with dermatology? Yes     02/15/2023    8:15 AM  Depression screen PHQ 2/9  Decreased Interest 0  Down, Depressed, Hopeless 0  PHQ - 2 Score 0    Other providers/specialists: Patient Care Team: Jarold Motto, [place] as PCP - General (Physician Assistant)    PMHx, SurgHx, SocialHx, Medications, and Allergies were reviewed in the Visit Navigator and updated as appropriate.   No past medical history  on file.   Past Surgical History:  Procedure Laterality Date   LUMBAR DISC ARTHROPLASTY       Family History  Problem Relation Age of Onset   Thyroid disease Mother    Hypertension Mother    Hyperlipidemia Mother    Hyperlipidemia Father    Obesity Brother    COPD Maternal Grandmother    Stroke Maternal Grandfather        before age 53   Breast cancer Paternal Grandmother    Colon cancer Neg Hx    Prostate cancer Neg Hx     Social History   Tobacco Use   Smoking status: Never   Smokeless tobacco: Never  Vaping Use   Vaping Use: Never used  Substance Use Topics   Alcohol use: Yes    Alcohol/week: 3.0 standard drinks of alcohol    Types: 3 Standard drinks or equivalent per week   Drug use: No    Review of Systems:   Review of Systems  Constitutional:  Negative for chills, fever, malaise/fatigue and weight loss.  HENT:  Negative for hearing loss, sinus pain and sore throat.   Respiratory:  Negative for cough and hemoptysis.   Cardiovascular:  Negative for chest pain, palpitations, leg swelling and PND.  Gastrointestinal:  Negative for abdominal pain, constipation, diarrhea, heartburn,  nausea and vomiting.  Genitourinary:  Negative for dysuria, frequency and urgency.  Musculoskeletal:  Negative for back pain, myalgias and neck pain.  Skin:  Negative for itching and rash.  Neurological:  Negative for dizziness, tingling, seizures and headaches.  Endo/Heme/Allergies:  Negative for polydipsia.  Psychiatric/Behavioral:  Negative for depression. The patient is not nervous/anxious.     Objective:    Vitals:   02/15/23 0815  BP: 120/80  Pulse: 61  Temp: 98 F (36.7 C)  SpO2: 99%    Body mass index is 27.05 kg/m.  General  Alert, cooperative, no distress, appears stated age  Head:  Normocephalic, without obvious abnormality, atraumatic  Eyes:  PERRL, conjunctiva/corneas clear, EOM's intact, fundi benign, both eyes       Ears:  Normal TM's and external ear  canals, both ears  Nose: Nares normal, septum midline, mucosa normal, no drainage or sinus tenderness  Throat: Lips, mucosa, and tongue normal; teeth and gums normal  Neck: Supple, symmetrical, trachea midline, no adenopathy;     thyroid:  No enlargement/tenderness/nodules; no carotid bruit or JVD  Back:   Symmetric, no curvature, ROM normal, no CVA tenderness  Lungs:   Clear to auscultation bilaterally, respirations unlabored  Chest wall:  No tenderness or deformity  Heart:  Regular rate and rhythm, S1 and S2 normal, no murmur, rub or gallop  Abdomen:   Soft, non-tender, bowel sounds active all four quadrants, no masses, no organomegaly  Extremities: Extremities normal, atraumatic, no cyanosis or edema  Prostate : Deferred   Skin: Skin color, texture, turgor normal, no rashes or lesions  Lymph nodes: Cervical, supraclavicular, and axillary nodes normal  Neurologic: CNII-XII grossly intact. Normal strength, sensation and reflexes throughout   AssessmentPlan:   Routine physical examination Today patient counseled on age appropriate routine health concerns for screening and prevention, each reviewed and up to date or declined. Immunizations reviewed and up to date or declined. Labs ordered and reviewed. Risk factors for depression reviewed and negative. Hearing function and visual acuity are intact. ADLs screened and addressed as needed. Functional ability and level of safety reviewed and appropriate. Education, counseling and referrals performed based on assessed risks today. Patient provided with a copy of personalized plan for preventive services.  Hyperlipidemia, unspecified hyperlipidemia type Update lipid panel and adjust pravastatin 40 mg daily  Overweight Continue efforts at healthy lifestyle     Jarold Motto, PA-C [facility]

## 2023-03-04 ENCOUNTER — Other Ambulatory Visit: Payer: Self-pay | Admitting: Physician Assistant

## 2023-10-22 IMAGING — CT CT CARDIAC CORONARY ARTERY CALCIUM SCORE
3 series · 13 of 20 positions shown, 15 images · non-contrast
Comparison: None.

Addendum:
CLINICAL DATA: Cardiovascular Disease Risk stratification

EXAM:
Coronary Calcium Score
TECHNIQUE: A gated, non-contrast computed tomography scan of the heart was
performed using 3mm slice thickness. Axial images were analyzed on a
dedicated workstation. Calcium scoring of the coronary arteries was
performed using the Agatston method.

[Series 2: cascseq 2.0 sa36 70% (id) · axial · 0.45mm/px · z∈[-290,-222]mm · 3 of 85 slices shown]
[im 17/85  vessel]
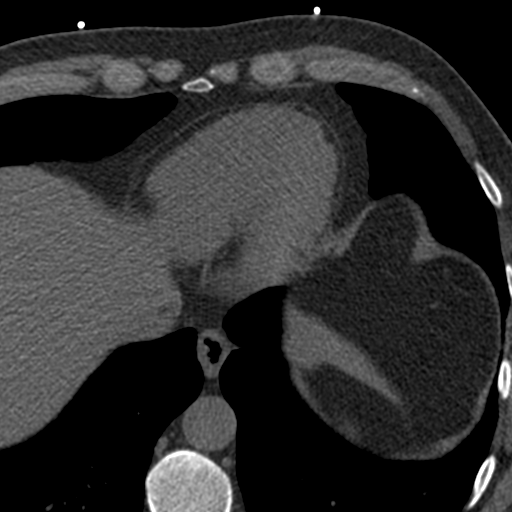
[im 34/85  vessel]
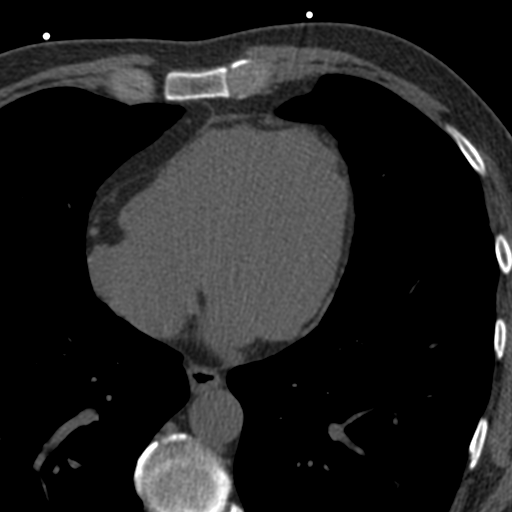
[im 51/85  vessel]
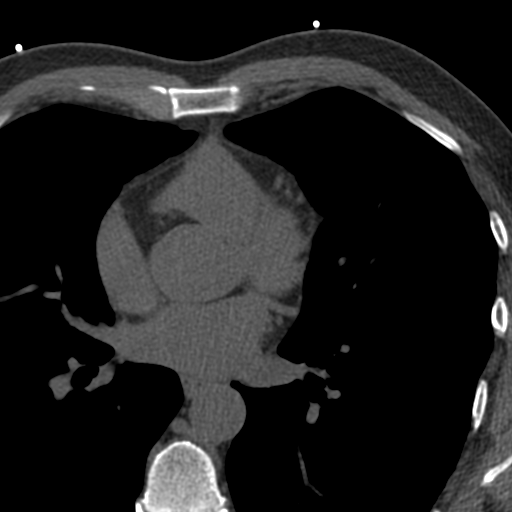

[Series 3: cascseq 2.0 bf37 st · axial · 0.72mm/px · z∈[-294,-182]mm · 5 of 85 slices shown, 7 images]
[im 15/85  vessel]
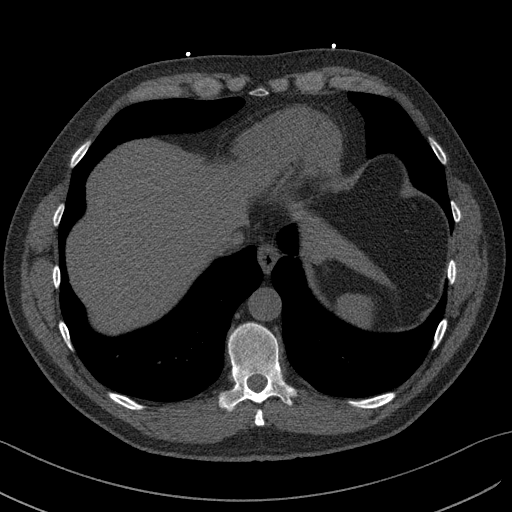
[im 15/85  lung]
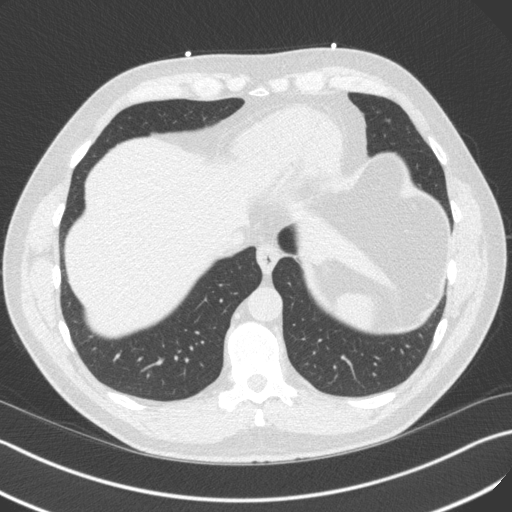
[im 29/85  vessel]
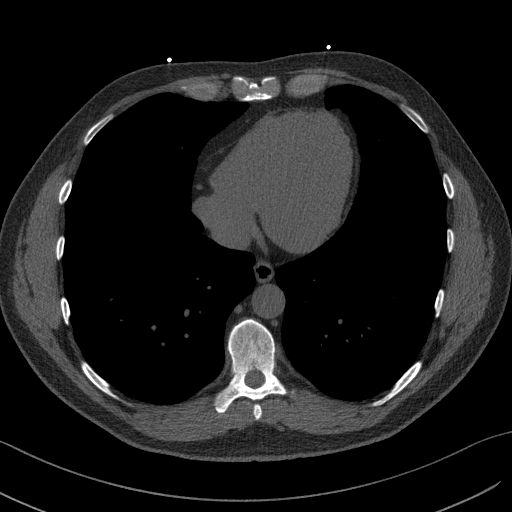
[im 43/85  vessel]
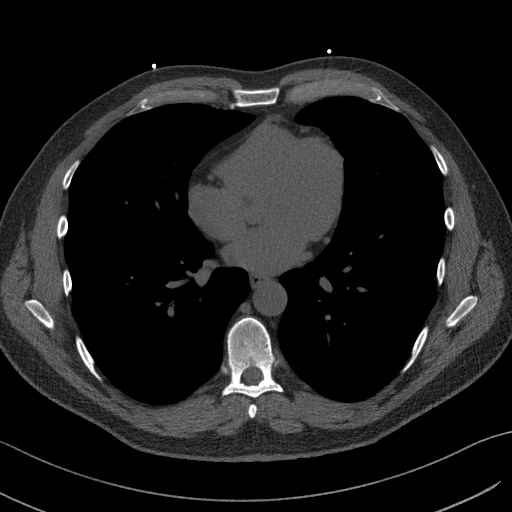
[im 57/85  vessel]
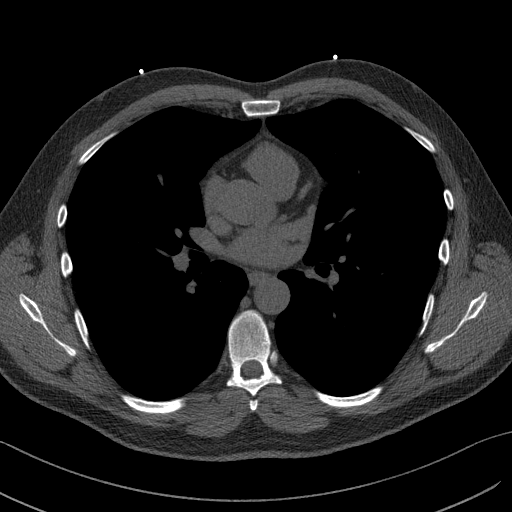
[im 71/85  vessel]
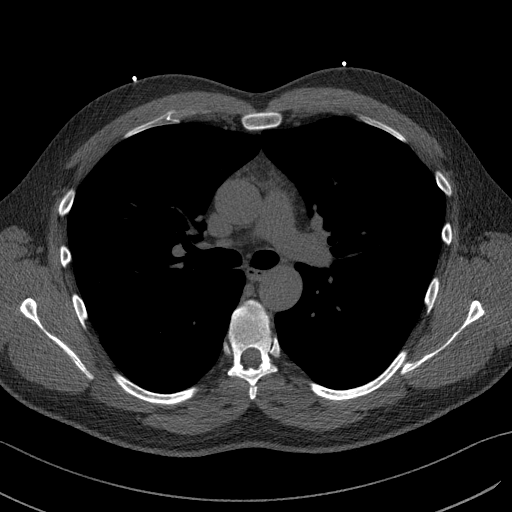
[im 71/85  lung]
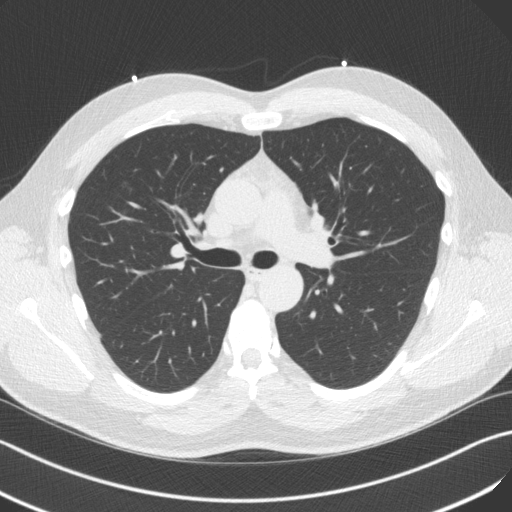

[Series 4: cascseq 2.0 br59 lung · axial · 0.72mm/px · z∈[-294,-182]mm · 5 of 85 slices shown]
[im 15/85  lung]
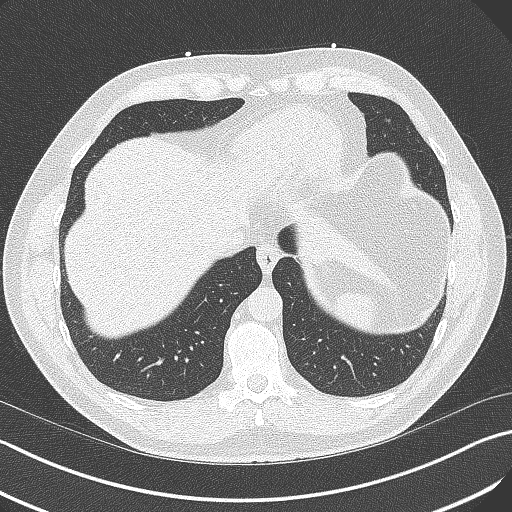
[im 29/85  lung]
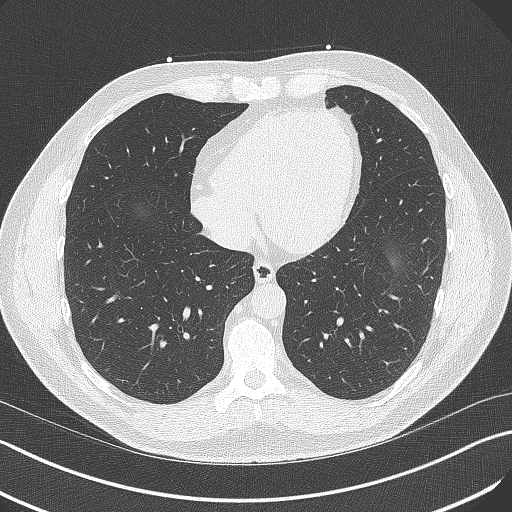
[im 43/85  lung]
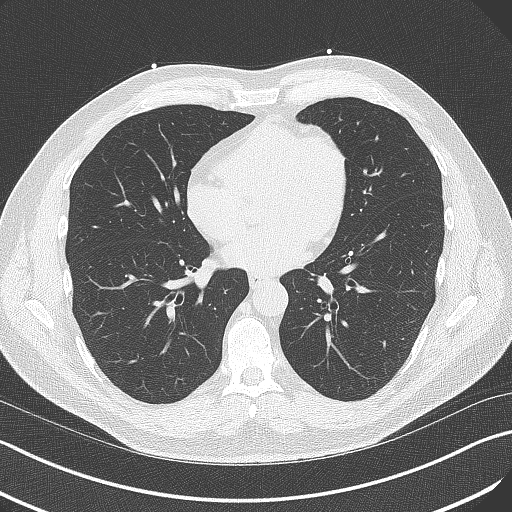
[im 57/85  lung]
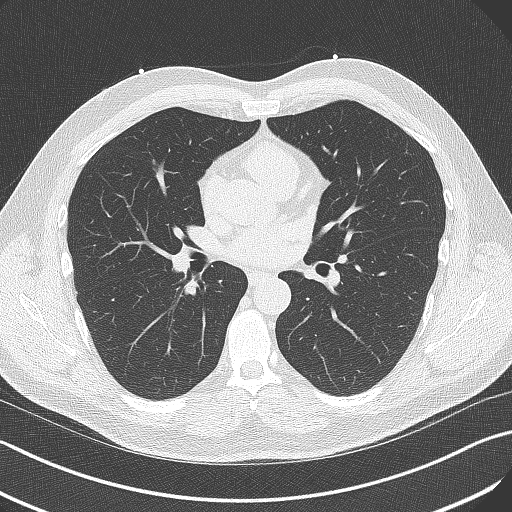
[im 71/85  lung]
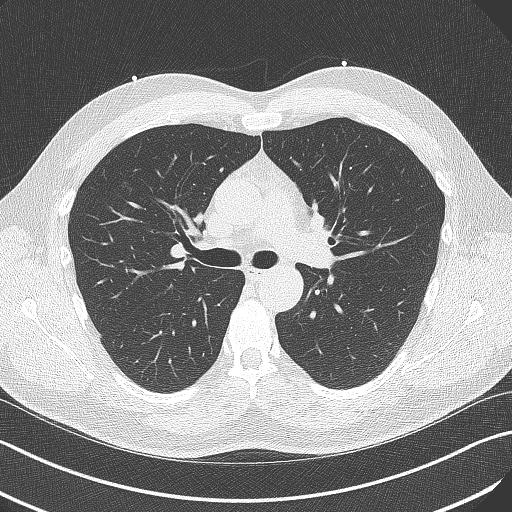

[13 of 20 positions shown; findings below may reference images not displayed]

FINDINGS: Coronary Calcium Score:

Left main: 0

Left anterior descending artery:

Left circumflex artery: 0

Right coronary artery: 0

Total:

Pericardium: Normal.

Ascending Aorta: Normal caliber.

Non-cardiac: See separate report from [REDACTED].
IMPRESSION: Coronary calcium score of 9.31.



If CAC=0, it is reasonable to withhold statin therapy and reassess
in 5 to 10 years, as long as higher risk conditions are absent
(diabetes mellitus, family history of premature CHD in first degree
relatives (males <55 years; females <65 years), cigarette smoking,
or LDL >=190 mg/dL).

If CAC is 1 to 99, it is reasonable to initiate statin therapy for
patients >=55 years of age.

If CAC is >=100 or >=75th percentile, it is reasonable to initiate
statin therapy at any age.

Cardiology referral should be considered for patients with CAC
scores >=400 or >=75th percentile.

*8688 AHA/ACC/AACVPR/AAPA/ABC/LIPANOVIC/PEREDNIENE/RIGERSI/Toshi/PATERS/BLAIN/MARIA DORALBA
Guideline on the Management of Blood Cholesterol: A Report of the
American College of Cardiology/American Heart Association Task Force
on Clinical Practice Guidelines. J Am Coll Cardiol.
8297;73(24):9382-9076.

ADDENDUM:
The following report is an over-read performed by radiologist Dr.
over-read does not include interpretation of cardiac or coronary
anatomy or pathology. The coronary calcium interpretation by the
cardiologist is attached.
FINDINGS: Vascular: No acute non-cardiac vascular finding.

Mediastinum/Nodes: No pathologically enlarged mediastinal, or hilar
lymph nodes, noting limited sensitivity for the detection of hilar
adenopathy on this noncontrast study. Visualized portions of the
esophagus are grossly unremarkable

Lungs/Pleura: Multiple clustered predominantly ground-glass right
upper lobe pulmonary nodules for instance a 7 mm ground-glass
pulmonary nodule on image [DATE] and a mixed 7 mm nodule on image [DATE]
with a 2 mm solid component.

Upper Abdomen: Visualized portions of the upper abdomen are
unremarkable.

Musculoskeletal: There are no aggressive appearing lytic or blastic
lesions noted in the visualized portions of the skeleton.
IMPRESSION: Multiple pulmonary nodules. Most severe: 7 mm right part-solid
pulmonary nodule within the upper lobe. Recommend a non-contrast
Chest CT at 3-6 months. Subsequent management based on the most
suspicious nodule(s). These guidelines do not apply to
immunocompromised patients and patients with cancer. Follow up in
patients with significant comorbidities as clinically warranted. For
lung cancer screening, adhere to Lung-RADS guidelines. Reference:
Radiology. 7337; 284(1):228-43.

*** End of Addendum ***
FINDINGS: Coronary Calcium Score:

Left main: 0

Left anterior descending artery:

Left circumflex artery: 0

Right coronary artery: 0

Total:

Pericardium: Normal.

Ascending Aorta: Normal caliber.

Non-cardiac: See separate report from [REDACTED].
IMPRESSION: Coronary calcium score of 9.31.



If CAC=0, it is reasonable to withhold statin therapy and reassess
in 5 to 10 years, as long as higher risk conditions are absent
(diabetes mellitus, family history of premature CHD in first degree
relatives (males <55 years; females <65 years), cigarette smoking,
or LDL >=190 mg/dL).

If CAC is 1 to 99, it is reasonable to initiate statin therapy for
patients >=55 years of age.

If CAC is >=100 or >=75th percentile, it is reasonable to initiate
statin therapy at any age.

Cardiology referral should be considered for patients with CAC
scores >=400 or >=75th percentile.

*8688 AHA/ACC/AACVPR/AAPA/ABC/LIPANOVIC/PEREDNIENE/RIGERSI/Toshi/PATERS/BLAIN/MARIA DORALBA
Guideline on the Management of Blood Cholesterol: A Report of the
American College of Cardiology/American Heart Association Task Force
on Clinical Practice Guidelines. J Am Coll Cardiol.
8297;73(24):9382-9076.

## 2024-02-16 NOTE — Progress Notes (Signed)
 Subjective:    Lucas Hoover. is a 43 y.o. male and is here for a comprehensive physical exam.  HPI  Health Maintenance Due  Topic Date Due   COVID-19 Vaccine (4 - 2024-25 season) 06/13/2023    Acute Concerns: ***  Chronic Issues: Hyperlipidemia: Pt is on *** Pravastatin  40 mg once daily.  Good compliance and tolerance reported ***  ***  Health Maintenance: Immunizations -- *** Colonoscopy -- N/a PSA -- No results found for: "PSA1", "PSA" Diet -- *** Exercise -- *** Sleep habits -- *** No concerns   Weight --  Recent weight history Wt Readings from Last 10 Encounters:  02/15/23 222 lb 4 oz (100.8 kg)  11/04/22 222 lb 4 oz (100.8 kg)  07/31/22 223 lb 12.8 oz (101.5 kg)  02/10/22 220 lb 4 oz (99.9 kg)  02/07/21 216 lb 4 oz (98.1 kg)  11/18/20 215 lb (97.5 kg)  03/08/19 185 lb (83.9 kg)  10/31/18 186 lb 4 oz (84.5 kg)  09/09/17 201 lb 12.8 oz (91.5 kg)  07/19/17 199 lb (90.3 kg)   There is no height or weight on file to calculate BMI.  Mood -- *** Stable.  Alcohol use --  reports current alcohol use of about 3.0 standard drinks of alcohol per week.  Tobacco use --  Tobacco Use: Low Risk  (02/15/2023)   Patient History    Smoking Tobacco Use: Never    Smokeless Tobacco Use: Never    Passive Exposure: Not on file    Eligible for Low Dose CT?  UTD with eye doctor? *** UTD with dentist? ***     02/15/2023    8:15 AM  Depression screen PHQ 2/9  Decreased Interest 0  Down, Depressed, Hopeless 0  PHQ - 2 Score 0    Other providers/specialists: Patient Care Team: Alexander Iba, [place] as PCP - General (Physician Assistant)    PMHx, SurgHx, SocialHx, Medications, and Allergies were reviewed in the Visit Navigator and updated as appropriate.   No past medical history on file.   Past Surgical History:  Procedure Laterality Date   LUMBAR DISC ARTHROPLASTY       Family History  Problem Relation Age of Onset   Thyroid disease Mother     Hypertension Mother    Hyperlipidemia Mother    Hyperlipidemia Father    Obesity Brother    COPD Maternal Grandmother    Stroke Maternal Grandfather        before age 57   Breast cancer Paternal Grandmother    Colon cancer Neg Hx    Prostate cancer Neg Hx     Social History   Tobacco Use   Smoking status: Never   Smokeless tobacco: Never  Vaping Use   Vaping status: Never Used  Substance Use Topics   Alcohol use: Yes    Alcohol/week: 3.0 standard drinks of alcohol    Types: 3 Standard drinks or equivalent per week   Drug use: No    Review of Systems:   ROS - Negative unless otherwise specified per HPI.   Objective:   There were no vitals filed for this visit.  There is no height or weight on file to calculate BMI.  General  Alert, cooperative, no distress, appears stated age  Head:  Normocephalic, without obvious abnormality, atraumatic  Eyes:  PERRL, conjunctiva/corneas clear, EOM's intact, fundi benign, both eyes       Ears:  Normal TM's and external ear canals, both ears  Nose: Nares  normal, septum midline, mucosa normal, no drainage or sinus tenderness  Throat: Lips, mucosa, and tongue normal; teeth and gums normal  Neck: Supple, symmetrical, trachea midline, no adenopathy;     thyroid:  No enlargement/tenderness/nodules; no carotid bruit or JVD  Back:   Symmetric, no curvature, ROM normal, no CVA tenderness  Lungs:   Clear to auscultation bilaterally, respirations unlabored  Chest wall:  No tenderness or deformity  Heart:  Regular rate and rhythm, S1 and S2 normal, no murmur, rub or gallop  Abdomen:   Soft, non-tender, bowel sounds active all four quadrants, no masses, no organomegaly  Extremities: Extremities normal, atraumatic, no cyanosis or edema  Prostate : ***   Skin: Skin color, texture, turgor normal, no rashes or lesions  Lymph nodes: Cervical, supraclavicular, and axillary nodes normal  Neurologic: CNII-XII grossly intact. Normal strength,  sensation and reflexes throughout   AssessmentPlan:   There are no diagnoses linked to this encounter.   I, Bernita Bristle, acting as a Neurosurgeon for Alexander Iba, [place]., have documented all relevant documentation on the behalf of Alexander Iba, [place], as directed by   while in the presence of Alexander Iba, [place].  I, Alexander Iba, [place], have reviewed all documentation for this visit. The documentation on 02/16/24 for the exam, diagnosis, procedures, and orders are all accurate and complete.  Alexander Iba, PA-C [place] [facility]

## 2024-02-17 ENCOUNTER — Ambulatory Visit: Payer: 59 | Admitting: Physician Assistant

## 2024-02-17 ENCOUNTER — Encounter: Payer: Self-pay | Admitting: Physician Assistant

## 2024-02-17 VITALS — BP 108/80 | HR 75 | Temp 98.0°F | Ht 76.0 in | Wt 221.6 lb

## 2024-02-17 DIAGNOSIS — Z0000 Encounter for general adult medical examination without abnormal findings: Secondary | ICD-10-CM

## 2024-02-17 DIAGNOSIS — E663 Overweight: Secondary | ICD-10-CM | POA: Diagnosis not present

## 2024-02-17 DIAGNOSIS — E785 Hyperlipidemia, unspecified: Secondary | ICD-10-CM | POA: Diagnosis not present

## 2024-02-17 LAB — LIPID PANEL
Cholesterol: 212 mg/dL — ABNORMAL HIGH (ref 0–200)
HDL: 48.5 mg/dL (ref >39.00)
LDL Cholesterol: 142 mg/dL — ABNORMAL HIGH (ref 0–99)
NonHDL: 163
Total CHOL/HDL Ratio: 4
Triglycerides: 104 mg/dL (ref 0.0–149.0)
VLDL: 20.8 mg/dL (ref 0.0–40.0)

## 2024-02-17 LAB — COMPREHENSIVE METABOLIC PANEL WITH GFR
ALT: 20 U/L (ref 0–53)
AST: 18 U/L (ref 0–37)
Albumin: 4.8 g/dL (ref 3.5–5.2)
Alkaline Phosphatase: 69 U/L (ref 39–117)
BUN: 14 mg/dL (ref 6–23)
CO2: 28 meq/L (ref 19–32)
Calcium: 9.4 mg/dL (ref 8.4–10.5)
Chloride: 102 meq/L (ref 96–112)
Creatinine, Ser: 0.93 mg/dL (ref 0.40–1.50)
GFR: 101.24 mL/min (ref >60.00)
Glucose, Bld: 90 mg/dL (ref 70–99)
Potassium: 4.2 meq/L (ref 3.5–5.1)
Sodium: 139 meq/L (ref 135–145)
Total Bilirubin: 0.5 mg/dL (ref 0.2–1.2)
Total Protein: 7.8 g/dL (ref 6.0–8.3)

## 2024-02-17 LAB — CBC WITH DIFFERENTIAL/PLATELET
Basophils Absolute: 0 10*3/uL (ref 0.0–0.1)
Basophils Relative: 0.2 % (ref 0.0–3.0)
Eosinophils Absolute: 0.1 10*3/uL (ref 0.0–0.7)
Eosinophils Relative: 1.7 % (ref 0.0–5.0)
HCT: 46 % (ref 39.0–52.0)
Hemoglobin: 15.4 g/dL (ref 13.0–17.0)
Lymphocytes Relative: 25.7 % (ref 12.0–46.0)
Lymphs Abs: 1.4 10*3/uL (ref 0.7–4.0)
MCHC: 33.5 g/dL (ref 30.0–36.0)
MCV: 88.9 fl (ref 78.0–100.0)
Monocytes Absolute: 0.5 10*3/uL (ref 0.1–1.0)
Monocytes Relative: 10.2 % (ref 3.0–12.0)
Neutro Abs: 3.4 10*3/uL (ref 1.4–7.7)
Neutrophils Relative %: 62.2 % (ref 43.0–77.0)
Platelets: 304 10*3/uL (ref 150.0–400.0)
RBC: 5.18 Mil/uL (ref 4.22–5.81)
RDW: 12.8 % (ref 11.5–15.5)
WBC: 5.4 10*3/uL (ref 4.0–10.5)

## 2024-02-17 NOTE — Patient Instructions (Signed)
 It was great to see you!  Please go to the lab for blood work.   Our office will call you with your results unless you have chosen to receive results via MyChart.  If your blood work is normal we will follow-up each year for physicals and as scheduled for chronic medical problems.  If anything is abnormal we will treat accordingly and get you in for a follow-up.  Take care,  Lelon Mast

## 2024-02-18 ENCOUNTER — Encounter: Payer: Self-pay | Admitting: Physician Assistant

## 2024-02-18 ENCOUNTER — Other Ambulatory Visit: Payer: Self-pay | Admitting: Physician Assistant

## 2024-02-18 DIAGNOSIS — E785 Hyperlipidemia, unspecified: Secondary | ICD-10-CM

## 2024-02-18 MED ORDER — PRAVASTATIN SODIUM 80 MG PO TABS: 80.0000 mg | ORAL_TABLET | Freq: Every day | ORAL | 3 refills | Status: AC

## 2024-02-18 NOTE — Telephone Encounter (Signed)
 No further action needed.

## 2024-02-18 NOTE — Telephone Encounter (Signed)
 Please see pt response and advise on future labs/appt to follow up with patient

## 2024-05-23 ENCOUNTER — Telehealth: Payer: Self-pay

## 2024-05-23 DIAGNOSIS — E785 Hyperlipidemia, unspecified: Secondary | ICD-10-CM

## 2024-05-23 NOTE — Telephone Encounter (Signed)
 Copied from CRM [id number]. Topic: Clinical - Request for Lab/Test Order >> May 23, 2024 12:45 PM Willma R wrote: Reason for CRM: Patient calling to schedule lab. Orders placed on 05/09/, but expired on 08/09. Requesting the order to be resubmitted to schedule an appointment.   Patients wife Alan can be reached at [phone]   Okay to reorder labs?

## 2024-05-29 NOTE — Telephone Encounter (Signed)
 Patient spouse(Amanda) called back inquiry if order have been re-ordered for patient to schedule lab appointment. Labs currently not  in system. Please place order and contact patient to schedule lab appointment.  CB#[phone]

## 2024-05-29 NOTE — Telephone Encounter (Signed)
 Please call patient and schedule lab appt. Future orders are in Epic.

## 2024-05-29 NOTE — Addendum Note (Signed)
 Addended by: THURMON ARLAND PARAS on: 05/29/2024 02:32 PM   Modules accepted: Orders

## 2024-06-01 ENCOUNTER — Other Ambulatory Visit (INDEPENDENT_AMBULATORY_CARE_PROVIDER_SITE_OTHER)

## 2024-06-01 ENCOUNTER — Ambulatory Visit: Payer: Self-pay | Admitting: Physician Assistant

## 2024-06-01 DIAGNOSIS — E785 Hyperlipidemia, unspecified: Secondary | ICD-10-CM | POA: Diagnosis not present

## 2024-06-01 LAB — LIPID PANEL
Cholesterol: 166 mg/dL (ref 0–200)
HDL: 53.2 mg/dL (ref >39.00)
LDL Cholesterol: 94 mg/dL (ref 0–99)
NonHDL: 112.4
Total CHOL/HDL Ratio: 3
Triglycerides: 91 mg/dL (ref 0.0–149.0)
VLDL: 18.2 mg/dL (ref 0.0–40.0)

## 2025-02-20 ENCOUNTER — Encounter: Admitting: Physician Assistant
